# Patient Record
Sex: Female | Born: 1965 | Race: White | Hispanic: No | Marital: Married | Smoking: Never smoker
Health system: Southern US, Community
[De-identification: ages and names within clinical notes are randomized; demographics above are authoritative.]

## PROBLEM LIST (undated history)

## (undated) DIAGNOSIS — D6851 Activated protein C resistance: Secondary | ICD-10-CM

## (undated) DIAGNOSIS — I809 Phlebitis and thrombophlebitis of unspecified site: Secondary | ICD-10-CM

## (undated) DIAGNOSIS — I10 Essential (primary) hypertension: Secondary | ICD-10-CM

## (undated) HISTORY — PX: ABDOMINAL HYSTERECTOMY: SHX81

## (undated) HISTORY — DX: Essential (primary) hypertension: I10

## (undated) HISTORY — PX: BACK SURGERY: SHX140

## (undated) HISTORY — DX: Activated protein C resistance: D68.51

## (undated) HISTORY — PX: CHOLECYSTECTOMY: SHX55

## (undated) HISTORY — DX: Phlebitis and thrombophlebitis of unspecified site: I80.9

---

## 1998-05-12 HISTORY — PX: LUMBAR PERITONEAL SHUNT: SHX1984

## 2005-02-03 ENCOUNTER — Inpatient Hospital Stay: Payer: Self-pay | Admitting: Obstetrics and Gynecology

## 2006-05-12 DIAGNOSIS — I809 Phlebitis and thrombophlebitis of unspecified site: Secondary | ICD-10-CM

## 2006-05-12 DIAGNOSIS — D6851 Activated protein C resistance: Secondary | ICD-10-CM

## 2006-05-12 HISTORY — DX: Phlebitis and thrombophlebitis of unspecified site: I80.9

## 2006-05-12 HISTORY — DX: Activated protein C resistance: D68.51

## 2006-08-07 ENCOUNTER — Ambulatory Visit: Payer: Self-pay | Admitting: Unknown Physician Specialty

## 2006-10-08 ENCOUNTER — Ambulatory Visit: Payer: Self-pay | Admitting: Unknown Physician Specialty

## 2006-10-12 ENCOUNTER — Ambulatory Visit: Payer: Self-pay | Admitting: Unknown Physician Specialty

## 2007-04-26 ENCOUNTER — Ambulatory Visit: Payer: Self-pay | Admitting: Unknown Physician Specialty

## 2008-09-21 ENCOUNTER — Ambulatory Visit: Payer: Self-pay | Admitting: Unknown Physician Specialty

## 2009-08-30 ENCOUNTER — Emergency Department: Payer: Self-pay | Admitting: Emergency Medicine

## 2009-10-10 ENCOUNTER — Ambulatory Visit: Payer: Self-pay | Admitting: Gynecologic Oncology

## 2009-10-16 ENCOUNTER — Ambulatory Visit: Payer: Self-pay | Admitting: Gynecologic Oncology

## 2009-11-09 ENCOUNTER — Ambulatory Visit: Payer: Self-pay | Admitting: Gynecologic Oncology

## 2010-02-09 ENCOUNTER — Ambulatory Visit: Payer: Self-pay | Admitting: Gynecologic Oncology

## 2010-02-12 ENCOUNTER — Ambulatory Visit: Payer: Self-pay | Admitting: Gynecologic Oncology

## 2010-03-12 ENCOUNTER — Ambulatory Visit: Payer: Self-pay | Admitting: Gynecologic Oncology

## 2010-03-16 ENCOUNTER — Other Ambulatory Visit: Payer: Self-pay | Admitting: Internal Medicine

## 2010-04-02 ENCOUNTER — Ambulatory Visit: Payer: Self-pay | Admitting: Unknown Physician Specialty

## 2011-08-27 ENCOUNTER — Ambulatory Visit: Payer: Self-pay | Admitting: Obstetrics and Gynecology

## 2012-12-15 ENCOUNTER — Ambulatory Visit: Payer: Self-pay | Admitting: Obstetrics and Gynecology

## 2013-12-06 DIAGNOSIS — K589 Irritable bowel syndrome without diarrhea: Secondary | ICD-10-CM | POA: Insufficient documentation

## 2013-12-16 ENCOUNTER — Other Ambulatory Visit: Payer: Self-pay | Admitting: Gastroenterology

## 2013-12-16 LAB — CLOSTRIDIUM DIFFICILE(ARMC)

## 2013-12-25 LAB — STOOL CULTURE

## 2013-12-30 ENCOUNTER — Ambulatory Visit: Payer: Self-pay | Admitting: Gastroenterology

## 2013-12-30 LAB — PROTIME-INR
INR: 1.1
Prothrombin Time: 13.7 secs (ref 11.5–14.7)

## 2014-01-02 LAB — PATHOLOGY REPORT

## 2014-04-10 ENCOUNTER — Ambulatory Visit: Payer: Self-pay | Admitting: Physician Assistant

## 2014-07-13 DIAGNOSIS — D6851 Activated protein C resistance: Secondary | ICD-10-CM | POA: Insufficient documentation

## 2014-07-18 ENCOUNTER — Ambulatory Visit
Admit: 2014-07-18 | Disposition: A | Discharge status: Home / Self Care | Payer: Self-pay | Attending: Internal Medicine | Admitting: Internal Medicine

## 2014-08-04 ENCOUNTER — Ambulatory Visit
Admit: 2014-08-04 | Disposition: A | Discharge status: Home / Self Care | Payer: Self-pay | Attending: Internal Medicine | Admitting: Internal Medicine

## 2014-08-11 ENCOUNTER — Ambulatory Visit
Admit: 2014-08-11 | Disposition: A | Discharge status: Home / Self Care | Payer: Self-pay | Attending: Internal Medicine | Admitting: Internal Medicine

## 2015-08-23 DIAGNOSIS — Z6841 Body Mass Index (BMI) 40.0 and over, adult: Secondary | ICD-10-CM

## 2016-03-11 ENCOUNTER — Other Ambulatory Visit: Payer: Self-pay | Admitting: Physician Assistant

## 2016-03-11 DIAGNOSIS — Z1231 Encounter for screening mammogram for malignant neoplasm of breast: Secondary | ICD-10-CM

## 2016-04-17 ENCOUNTER — Ambulatory Visit: Payer: Self-pay

## 2016-04-21 ENCOUNTER — Ambulatory Visit: Payer: Self-pay

## 2016-05-23 ENCOUNTER — Ambulatory Visit: Payer: Self-pay

## 2016-06-10 ENCOUNTER — Inpatient Hospital Stay: Payer: 59 | Admitting: Oncology

## 2016-06-11 ENCOUNTER — Ambulatory Visit: Payer: Self-pay | Admitting: Hematology and Oncology

## 2016-06-13 ENCOUNTER — Inpatient Hospital Stay: Payer: 59 | Admitting: Oncology

## 2016-06-16 ENCOUNTER — Other Ambulatory Visit: Payer: Self-pay | Admitting: Physician Assistant

## 2016-06-16 ENCOUNTER — Ambulatory Visit
Admission: RE | Admit: 2016-06-16 | Discharge: 2016-06-16 | Disposition: A | Discharge status: Home / Self Care | Payer: 59 | Source: Ambulatory Visit | Attending: Physician Assistant | Admitting: Physician Assistant

## 2016-06-16 ENCOUNTER — Ambulatory Visit: Payer: 59

## 2016-06-16 DIAGNOSIS — Z1231 Encounter for screening mammogram for malignant neoplasm of breast: Secondary | ICD-10-CM

## 2016-07-01 ENCOUNTER — Encounter: Payer: Self-pay | Admitting: Oncology

## 2016-07-01 ENCOUNTER — Inpatient Hospital Stay: Payer: 59 | Attending: Oncology | Admitting: Oncology

## 2016-07-01 VITALS — BP 135/84 | HR 92 | Temp 97.6°F | Resp 18 | Ht 64.57 in | Wt 276.2 lb

## 2016-07-01 DIAGNOSIS — Z7901 Long term (current) use of anticoagulants: Secondary | ICD-10-CM | POA: Diagnosis not present

## 2016-07-01 DIAGNOSIS — Z86718 Personal history of other venous thrombosis and embolism: Secondary | ICD-10-CM | POA: Diagnosis not present

## 2016-07-01 DIAGNOSIS — I10 Essential (primary) hypertension: Secondary | ICD-10-CM | POA: Diagnosis not present

## 2016-07-01 DIAGNOSIS — G932 Benign intracranial hypertension: Secondary | ICD-10-CM | POA: Insufficient documentation

## 2016-07-01 DIAGNOSIS — I82501 Chronic embolism and thrombosis of unspecified deep veins of right lower extremity: Secondary | ICD-10-CM

## 2016-07-01 DIAGNOSIS — Z1231 Encounter for screening mammogram for malignant neoplasm of breast: Secondary | ICD-10-CM | POA: Diagnosis not present

## 2016-07-01 DIAGNOSIS — E669 Obesity, unspecified: Secondary | ICD-10-CM | POA: Diagnosis not present

## 2016-07-01 DIAGNOSIS — I8290 Acute embolism and thrombosis of unspecified vein: Secondary | ICD-10-CM | POA: Insufficient documentation

## 2016-07-01 DIAGNOSIS — D6851 Activated protein C resistance: Secondary | ICD-10-CM | POA: Diagnosis present

## 2016-07-01 DIAGNOSIS — J309 Allergic rhinitis, unspecified: Secondary | ICD-10-CM | POA: Insufficient documentation

## 2016-07-01 NOTE — Progress Notes (Signed)
Pt here for initial evaluation. She is referred by Ms Carrie Mew PA-pt wants to come off Warfarin and is here for evaluation for this

## 2016-07-01 NOTE — Progress Notes (Addendum)
Hematology/Oncology Consult note [facility] Telephone:([phone] [phone]  Patient Care Team: Marinda Elk, MD as PCP - General (Physician Assistant)   Name of the patient: Lisa Barry  [id number]  February 03, 1966    Reason for referral- h/o DVT and factor V leiden mutation. On chronic anticoagulation   Referring physician- Dr. Paulita Cradle   Date of visit: 07/01/16   History of presenting illness- Patient is a 51 yr old female with a h/o right lower extremity DVT about 10 years ago and was seen by vascular surgery Dr. Leotis Pain. She has some issues with varicose veins back then and underwent laser treatment. She is not exactly sure as to how the diagnosis of DVT came about when she does not recollect having any pain or swelling in her right lower extremity. Patient states that she has been on Coumadin since then but lately has been having problems maintaining her INR levels lately and she also reports periodic bruising Coumadin. There is a family history of DVT in both her mother and father when they were in their 36s. She thinks that they both have factor V Leiden but she is not sure. Patient was herself tested positive for factor V Leiden mutation although she is not sure if she was homozygous or heterozygous for the same and this testing was done at Dr. Bunnie Domino office as well. She was also seen by hematology here at [facility] but we are not able to retrieve those records. At this time she would like to come off her Coumadin if possible. Patient has not had any DVT/PE while she has been on Coumadin.   ECOG PS- 1  Pain scale- 0   Review of systems- Review of Systems  Constitutional: Negative for chills, fever, malaise/fatigue and weight loss.  HENT: Negative for congestion, ear discharge and nosebleeds.   Eyes: Negative for blurred vision.  Respiratory: Negative for cough, hemoptysis, sputum production, shortness of breath and wheezing.     Cardiovascular: Negative for chest pain, palpitations, orthopnea and claudication.  Gastrointestinal: Negative for abdominal pain, blood in stool, constipation, diarrhea, heartburn, melena, nausea and vomiting.  Genitourinary: Negative for dysuria, flank pain, frequency, hematuria and urgency.  Musculoskeletal: Negative for back pain, joint pain and myalgias.  Skin: Negative for rash.  Neurological: Negative for dizziness, tingling, focal weakness, seizures, weakness and headaches.  Endo/Heme/Allergies: Bruises/bleeds easily.  Psychiatric/Behavioral: Negative for depression and suicidal ideas. The patient does not have insomnia.     Allergies not on file  There are no active problems to display for this patient.    Past Medical History:  Diagnosis Date  . Blood clot associated with vein wall inflammation 2008  . Factor V Leiden ([place]) 2008  . Hypertension      Past Surgical History:  Procedure Laterality Date  . ABDOMINAL HYSTERECTOMY    . BACK SURGERY    . CHOLECYSTECTOMY    . LUMBAR PERITONEAL SHUNT  2000    Social History   Social History  . Marital status: Married    Spouse name: N/A  . Number of children: N/A  . Years of education: N/A   Occupational History  . Not on file.   Social History Main Topics  . Smoking status: Not on file  . Smokeless tobacco: Not on file  . Alcohol use Not on file  . Drug use: Unknown  . Sexual activity: Not on file   Other Topics Concern  . Not on file   Social History  Narrative  . No narrative on file     No family history on file.  No current outpatient prescriptions on file.   Physical exam:  Vitals:   07/01/16 1514  BP: 135/84  Pulse: 92  Resp: 18  Temp: 97.6 F (36.4 C)  TempSrc: Tympanic  Weight: 276 lb 3.8 oz (125.3 kg)  Height: 5' 4.57" (1.64 m)   Physical Exam  Constitutional: She is oriented to person, place, and time.  Patient is obese. Appears in no acute distress  HENT:  Head: Normocephalic  and atraumatic.  Eyes: EOM are normal. Pupils are equal, round, and reactive to light.  Neck: Normal range of motion.  Cardiovascular: Normal rate, regular rhythm and normal heart sounds.   Pulmonary/Chest: Effort normal and breath sounds normal.  Abdominal: Soft. Bowel sounds are normal.  Neurological: She is alert and oriented to person, place, and time.  Skin: Skin is warm and dry.        Mm Screening Breast Tomo Bilateral  Result Date: 06/16/2016 CLINICAL DATA:  Screening. EXAM: 2D DIGITAL SCREENING BILATERAL MAMMOGRAM WITH CAD AND ADJUNCT TOMO COMPARISON:  Previous exam(s). ACR Breast Density Category b: There are scattered areas of fibroglandular density. FINDINGS: There are no findings suspicious for malignancy. Images were processed with CAD. IMPRESSION: No mammographic evidence of malignancy. A result letter of this screening mammogram will be mailed directly to the patient. RECOMMENDATION: Screening mammogram in one year. (Code:SM-B-01Y) BI-RADS CATEGORY  1: Negative. Electronically Signed   By: Ammie Ferrier M.D.   On: 06/16/2016 12:02    Assessment and plan- Patient is a 51 y.o. female with a remote history of right lower extremity DVT and factor V Leiden mutation who is currently on Coumadin. Patient has been referred to [place] to see if she can come off anticoagulation.   Based on patient's history it appears that her DVT was diagnosed incidentally in the absence of any signs and symptoms. Also she probably had a distal lower extremity DVT and no evidence of proximal DVT. At this time I will try to obtain the results of the Doppler as well as factor V Leiden testing from Dr. Bunnie Domino office. If patient is found to be homozygous for factor V Leiden mutation or had an extensive right lower extremity DVT, then there remains a significant risk for recurrence once the Coumadin stops. In terms of other reversible risk factors patient is not a smoker but is obese which is a risk factor for  DVT. I have explained all this to the patient and we will get in touch with Dr. Bunnie Domino office and then give patient back call and decide about further management at that time. If patient is heterozygous for factor V Leiden mutation, then it would be reasonable for her to come off Coumadin but there remains a risk for recurrence in the future for both DVT and PE which the patient understands   Thank you for this kind referral and the opportunity to participate in the care of this patient   Visit Diagnosis 1. Factor V Leiden ([place])   2. Chronic deep vein thrombosis (DVT) of right lower extremity, unspecified vein (HCC)     Dr. Randa Evens, MD, MPH [facility] at [facility] Pager- [phone] 07/01/2016  4:33 PM    ADDENDUM 07/15/16: We were able to obtain genetic testing results that were done on the patient in the past which shows that she was heterozygous for factor V Leiden mutation. As such heterozygosity for factor  V Leiden mutation only modestly increases the chance of recurrent thromboembolism as compared to general population and does not play a role in the decision to continue or discontinue anticoagulation. We are unable to obtain prior Doppler results to see if patient had proximal lower extremity DVT in addition to distal lower extremity DVT which were on for a higher risk. I explained all this to the patient and recommended that this patient is having ongoing problems with bruising on Coumadin it may be okay to come off Coumadin at this time and continue low-dose aspirin at 81 mg. Patient does have other risk factors for recurrent DVT including obesity and she is trying to lose weight. Patient didn't continue to follow-up with her primary care doctor and if she were to have a recurrent DVT or PE in the future she will need to be restarted on anticoagulation and that has to be continued indefinitely at that time  Dr. Randa Evens, MD, MPH [facility] at [facility] [phone] 07/15/2016 2:18 PM

## 2016-09-02 ENCOUNTER — Encounter (INDEPENDENT_AMBULATORY_CARE_PROVIDER_SITE_OTHER): Payer: 59 | Admitting: Vascular Surgery

## 2016-09-09 ENCOUNTER — Encounter (INDEPENDENT_AMBULATORY_CARE_PROVIDER_SITE_OTHER): Payer: 59 | Admitting: Vascular Surgery

## 2016-10-07 ENCOUNTER — Encounter (INDEPENDENT_AMBULATORY_CARE_PROVIDER_SITE_OTHER): Payer: Self-pay

## 2016-10-07 ENCOUNTER — Ambulatory Visit (INDEPENDENT_AMBULATORY_CARE_PROVIDER_SITE_OTHER): Payer: 59 | Admitting: Vascular Surgery

## 2016-10-07 ENCOUNTER — Encounter (INDEPENDENT_AMBULATORY_CARE_PROVIDER_SITE_OTHER): Payer: Self-pay | Admitting: Vascular Surgery

## 2016-10-07 VITALS — BP 182/116 | HR 81 | Resp 16 | Ht 64.0 in | Wt 269.0 lb

## 2016-10-07 DIAGNOSIS — Z6841 Body Mass Index (BMI) 40.0 and over, adult: Secondary | ICD-10-CM

## 2016-10-07 DIAGNOSIS — I10 Essential (primary) hypertension: Secondary | ICD-10-CM

## 2016-10-07 DIAGNOSIS — I83813 Varicose veins of bilateral lower extremities with pain: Secondary | ICD-10-CM | POA: Diagnosis not present

## 2016-10-07 NOTE — Patient Instructions (Signed)
Varicose Vein Surgery Varicose vein surgery is a procedure to remove or repair varicose veins. These are swollen, twisted veins that are visible under the skin, especially in your legs. These veins may appear blue and bulging. All veins have a valve that keeps blood flowing in only one direction. If these valves get weak or damaged, blood can pool and cause varicose veins. You may have varicose vein surgery if your varicose veins are causing symptoms or complications, or if lifestyle changes have not helped. The surgery can reduce pain, aching, and the risk of bleeding and blood clots. Surgery can also improve the way the affected area looks (cosmetic appearance). The different types of varicose vein surgery include:  Injecting a chemical to close off a vein (sclerotherapy).  Treating a vein with light energy (laser treatment).  Using lasers or radio waves to close off a vein with heat (radiofrequency vein ablation).  Surgically removing the vein through a small incision (phlebectomy).  Surgically removing the vein through incisions after the vein has been tied off (vein ligation and stripping). Your health care provider will discuss the method that is best for you based on your condition. Tell a health care provider about:  Any allergies you have.  All medicines you are taking, including vitamins, herbs, eye drops, creams, and over-the-counter medicines.  Any problems you or family members have had with anesthetic medicines.  Any blood disorders you have.  Any surgeries you have had.  Any medical conditions you have. What are the risks? Generally, this is a safe procedure. However, problems can occur and include:  Damage to nearby nerves, tissues, or veins.  Sores.  Dark spots.  Skin irritation.  Numbness.  Clotting.  Infection.  Scarring.  Leg swelling.  Need for additional treatments. What happens before the procedure?  Ask your health care provider  about:  Changing or stopping your regular medicines. This is especially important if you are taking diabetes medicines or blood thinners.  Taking medicines such as aspirin and ibuprofen. These medicines can thin your blood. Do not take these medicines before your procedure if your health care provider instructs you not to.  You may have tests before varicose vein surgery. These can include a test to:  Check for clots and check blood flow using sound waves (Doppler ultrasound).  Observe how blood flows through your veins by injecting a dye that outlines your veins on X-rays (angiogram). This test is used in rare cases. What happens during the procedure? The procedure will vary depending on which type of varicose vein surgery you have: Sclerotherapy  This procedure is often used for small to medium veins.  A chemical (sclerosant) that irritates the lining of the vein will be injected into the vein. This will cause the varicose vein to be closed off.  Sclerosants in different amounts and strengths can be used, depending on the size and location of the vein.  You may need more than one treatment. Laser Treatment  This procedure does not involve incisions or chemicals.  Light energy from a laser will be directed onto the vein.  Laser treatment may be combined with sclerotherapy.  You may need more than one treatment. Radiofrequency Vein Ablation  You will be given a medicine that numbs the area (local anesthetic).  A small surgical cut (incision) will be made near the varicose vein.  A narrow tube (catheter) will be threaded into your vein.  The tip of the catheter will use either a laser or radio waves   to close the vein with heat. Phlebectomy  This surgical procedure is used to remove the veins closest to the skin.  You will be given a medicine that numbs the area (local anesthetic).  The surgeon will make a small puncture close to the varicose vein and then use a tiny hook  to pull out the vein. Vein Ligation and Stripping  This surgical procedure is used to treat severe cases.  For this procedure, you will be given a medicine that makes you go to sleep (general anesthetic).  The surgeon will make a small incision near the vein in your groin (saphenous vein).  First the surgeon will tie off (ligate) the vein.  Then the surgeon will make several more incisions along the vein.  The vein will be removed (stripped).  It may take 1-4 weeks to recover completely. What happens after the procedure?  Depending on the type of procedure that is performed, you may be able to return to your usual activities the day after the procedure.  You may have to wear compression stockings. These stockings help to prevent blood clots and reduce swelling in your legs. This information is not intended to replace advice given to you by your health care provider. Make sure you discuss any questions you have with your health care provider. Document Released: 05/18/2007 Document Revised: 10/04/2015 Document Reviewed: 10/05/2013 Elsevier Interactive Patient Education  2017 [employer]vier Inc.  

## 2016-10-07 NOTE — Assessment & Plan Note (Signed)
Contributes to her lower extremity swelling and increased venous pressure. Weight loss would be of benefit.

## 2016-10-07 NOTE — Assessment & Plan Note (Signed)
See venous plan as below

## 2016-10-07 NOTE — Progress Notes (Signed)
Patient ID: Lisa Barry, female   DOB: January 12, 1966, 51 y.o.   MRN: [id number]  Chief Complaint  Patient presents with  . Varicose Veins    Possible varicose veins    HPI Lisa Barry is a 51 y.o. female. The patient presents with complaints of symptomatic varicosities of the Lower extremities. The patient reports a long standing history of varicosities and they have become painful over time. She underwent treatment for venous disease about 6 years ago initially with good improvement but her symptoms have become worse over time. There was no clear inciting event or causative factor that started the symptoms.  The right leg is more severly affected. The patient elevates the legs for relief. The pain is described as stinging and burning. The symptoms are generally most severe in the evening, particularly when they have been on their feet for long periods of time. Elevation and anti-inflammatories has been used to try to improve the symptoms with limited success. She has also noticed more discoloration particularly in the right medial lower leg area. The patient complains of noticeable swelling as an associated symptom. The patient does have a previous history of DVT and factor V Leiden disorder.     Past Medical History:  Diagnosis Date  . Blood clot associated with vein wall inflammation 2008  . Factor V Leiden ([place]) 2008  . Hypertension     Past Surgical History:  Procedure Laterality Date  . ABDOMINAL HYSTERECTOMY    . BACK SURGERY    . CHOLECYSTECTOMY    . LUMBAR PERITONEAL SHUNT  2000    Family History  Problem Relation Age of Onset  . Hypertension Mother   . CAD Mother   . COPD Mother   . Bowel Disease Father   . Stroke Father   . Liver disease Sister     Social History Social History  Substance Use Topics  . Smoking status: Never Smoker  . Smokeless tobacco: Never Used  . Alcohol use 1.2 oz/week    2 Glasses of wine per week  No IV drug use she is  applying  Allergies  Allergen Reactions  . Other Other (See Comments)    AVOID STEROIDS DUE TO PREVIOUS EYE CONDITION  BIRTH CONTROL PILLS  . Sulfa Antibiotics     Other reaction(s): Unknown  . Tetracyclines & Related     Other reaction(s): Unknown    Current Outpatient Prescriptions  Medication Sig Dispense Refill  . amLODipine (NORVASC) 10 MG tablet Take 5 mg by mouth.     . citalopram (CELEXA) 20 MG tablet Take by mouth.    . furosemide (LASIX) 20 MG tablet Take by mouth.    . hyoscyamine (LEVBID) 0.375 MG 12 hr tablet     . loratadine (CLARITIN) 10 MG tablet Take by mouth.    . ondansetron (ZOFRAN) 4 MG tablet Take by mouth.    . potassium chloride (K-DUR) 10 MEQ tablet Take by mouth.    . valACYclovir (VALTREX) 500 MG tablet Take by mouth.    . valsartan-hydrochlorothiazide (DIOVAN-HCT) 320-12.5 MG tablet Take by mouth.     No current facility-administered medications for this visit.       REVIEW OF SYSTEMS (Negative unless checked)  Constitutional: [] Weight loss  [] Fever  [] Chills Cardiac: [] Chest pain   [] Chest pressure   [] Palpitations   [] Shortness of breath when laying flat   [] Shortness of breath at rest   [] Shortness of breath with exertion. Vascular:  [] Pain in legs  with walking   [] Pain in legs at rest   [] Pain in legs when laying flat   [] Claudication   [] Pain in feet when walking  [] Pain in feet at rest  [] Pain in feet when laying flat   [x] History of DVT   [] Phlebitis   [x] Swelling in legs   [x] Varicose veins   [] Non-healing ulcers Pulmonary:   [] Uses home oxygen   [] Productive cough   [] Hemoptysis   [] Wheeze  [] COPD   [] Asthma Neurologic:  [] Dizziness  [] Blackouts   [] Seizures   [] History of stroke   [] History of TIA  [] Aphasia   [] Temporary blindness   [] Dysphagia   [] Weakness or numbness in arms   [] Weakness or numbness in legs Musculoskeletal:  [] Arthritis   [] Joint swelling   [] Joint pain   [] Low back pain Hematologic:  [] Easy bruising  [] Easy bleeding    [] Hypercoagulable state   [] Anemic  [] Hepatitis Gastrointestinal:  [] Blood in stool   [] Vomiting blood  [] Gastroesophageal reflux/heartburn   [] Abdominal pain Genitourinary:  [] Chronic kidney disease   [] Difficult urination  [] Frequent urination  [] Burning with urination   [] Hematuria Skin:  [] Rashes   [] Ulcers   [] Wounds Psychological:  [] History of anxiety   []  History of major depression.    Physical Exam BP (!) 182/116 (BP Location: Right Arm)   Pulse 81   Resp 16   Ht 5\' 4"  (1.626 m)   Wt 269 lb (122 kg)   BMI 46.17 kg/m  Gen:  WD/WN, NAD Head: [place]/AT, No temporalis wasting.  Ear/Nose/Throat: Hearing grossly intact, dentition good Eyes: Sclera non-icteric. Conjunctiva clear Neck: Supple, no nuchal rigidity. Trachea midline Pulmonary:  Good air movement, no use of accessory muscles, respirations not labored.  Cardiac: RRR, No JVD Vascular: Varicosities diffuse and measuring up to 3 mm in the right lower extremity        Varicosities scattered and measuring up to 2 mm in the left lower extremity Vessel Right Left  Radial Palpable Palpable  Ulnar Palpable Palpable  Brachial Palpable Palpable  Carotid Palpable, without bruit Palpable, without bruit  Aorta Not palpable N/A  Femoral Palpable Palpable  Popliteal Palpable Palpable  PT Palpable Palpable  DP Palpable Palpable   Gastrointestinal: soft, non-tender/non-distended. Musculoskeletal: M/S 5/5 throughout.   1 + RLE edema.  Trace to 1 + LLE edema Neurologic: Sensation grossly intact in extremities.  Symmetrical.  Speech is fluent.  Psychiatric: Judgment intact, Mood & affect appropriate for pt's clinical situation. Dermatologic: No rashes or ulcers noted.  No cellulitis or open wounds.    Radiology No results found.  Labs No results found for this or any previous visit (from the past 2160 hour(s)).  Assessment/Plan:  HTN (hypertension) blood pressure control important in reducing the progression of  atherosclerotic disease. On appropriate oral medications.   Morbid obesity with BMI of 40.0-44.9, adult (HCC) Contributes to her lower extremity swelling and increased venous pressure. Weight loss would be of benefit.  Varicose veins of leg with pain, bilateral See venous plan as below    The patient has symptoms consistent with chronic venous insufficiency. We discussed the natural history and treatment options for venous disease. I recommended the regular use of 20 - 30 mm Hg compression stockings, and prescribed these today. I recommended leg elevation and anti-inflammatories as needed for pain. I have also recommended a complete venous duplex to assess the venous system for reflux or thrombotic issues. This can be done at the patient's convenience. I will see the patient back in  3 months to assess the response to conservative management, and determine further treatment options.     Leotis Pain 10/07/2016, 2:05 PM   This note was created with Dragon medical transcription system.  Any errors from dictation are unintentional.

## 2016-10-07 NOTE — Assessment & Plan Note (Signed)
blood pressure control important in reducing the progression of atherosclerotic disease. On appropriate oral medications.  

## 2017-01-14 ENCOUNTER — Other Ambulatory Visit (INDEPENDENT_AMBULATORY_CARE_PROVIDER_SITE_OTHER): Payer: Self-pay | Admitting: Vascular Surgery

## 2017-01-14 DIAGNOSIS — R52 Pain, unspecified: Secondary | ICD-10-CM

## 2017-01-16 ENCOUNTER — Ambulatory Visit (INDEPENDENT_AMBULATORY_CARE_PROVIDER_SITE_OTHER): Payer: 59 | Admitting: Vascular Surgery

## 2017-01-16 ENCOUNTER — Encounter (INDEPENDENT_AMBULATORY_CARE_PROVIDER_SITE_OTHER): Payer: 59

## 2017-02-27 ENCOUNTER — Other Ambulatory Visit: Payer: Self-pay | Admitting: Physician Assistant

## 2017-02-27 ENCOUNTER — Ambulatory Visit
Admission: RE | Admit: 2017-02-27 | Discharge: 2017-02-27 | Disposition: A | Discharge status: Home / Self Care | Payer: 59 | Source: Ambulatory Visit | Attending: Physician Assistant | Admitting: Physician Assistant

## 2017-02-27 ENCOUNTER — Other Ambulatory Visit
Admission: RE | Admit: 2017-02-27 | Discharge: 2017-02-27 | Disposition: A | Discharge status: Home / Self Care | Payer: 59 | Source: Ambulatory Visit | Attending: Physician Assistant | Admitting: Physician Assistant

## 2017-02-27 DIAGNOSIS — R0602 Shortness of breath: Secondary | ICD-10-CM | POA: Diagnosis present

## 2017-02-27 DIAGNOSIS — R0789 Other chest pain: Secondary | ICD-10-CM | POA: Diagnosis not present

## 2017-02-27 DIAGNOSIS — R7989 Other specified abnormal findings of blood chemistry: Secondary | ICD-10-CM

## 2017-02-27 LAB — FIBRIN DERIVATIVES D-DIMER (ARMC ONLY): Fibrin derivatives D-dimer (ARMC): 612.61 ng/mL (FEU) — ABNORMAL HIGH (ref 0.00–499.00)

## 2017-02-27 MED ORDER — IOPAMIDOL (ISOVUE-370) INJECTION 76%
75.0000 mL | Freq: Once | INTRAVENOUS | Status: AC | PRN
  Administered 2017-02-27: 75 mL via INTRAVENOUS

## 2017-04-24 ENCOUNTER — Other Ambulatory Visit: Payer: Self-pay | Admitting: Physician Assistant

## 2017-04-24 DIAGNOSIS — Z1231 Encounter for screening mammogram for malignant neoplasm of breast: Secondary | ICD-10-CM

## 2017-09-16 ENCOUNTER — Ambulatory Visit
Admission: RE | Admit: 2017-09-16 | Discharge: 2017-09-16 | Disposition: A | Discharge status: Home / Self Care | Payer: Managed Care, Other (non HMO) | Source: Ambulatory Visit | Attending: Physician Assistant | Admitting: Physician Assistant

## 2017-09-16 DIAGNOSIS — Z1231 Encounter for screening mammogram for malignant neoplasm of breast: Secondary | ICD-10-CM | POA: Insufficient documentation

## 2018-05-21 ENCOUNTER — Other Ambulatory Visit: Payer: Self-pay | Admitting: Physician Assistant

## 2018-05-21 DIAGNOSIS — Z1231 Encounter for screening mammogram for malignant neoplasm of breast: Secondary | ICD-10-CM

## 2018-11-10 DIAGNOSIS — G459 Transient cerebral ischemic attack, unspecified: Secondary | ICD-10-CM

## 2018-11-10 HISTORY — DX: Transient cerebral ischemic attack, unspecified: G45.9

## 2018-11-21 ENCOUNTER — Emergency Department
Admission: EM | Admit: 2018-11-21 | Discharge: 2018-11-21 | Disposition: A | Discharge status: Other Acute Inpatient Hospital | Payer: Managed Care, Other (non HMO) | Attending: Emergency Medicine | Admitting: Emergency Medicine

## 2018-11-21 ENCOUNTER — Other Ambulatory Visit: Payer: Self-pay

## 2018-11-21 ENCOUNTER — Emergency Department: Payer: Managed Care, Other (non HMO)

## 2018-11-21 ENCOUNTER — Encounter: Payer: Self-pay | Admitting: Emergency Medicine

## 2018-11-21 DIAGNOSIS — I10 Essential (primary) hypertension: Secondary | ICD-10-CM | POA: Insufficient documentation

## 2018-11-21 DIAGNOSIS — R42 Dizziness and giddiness: Secondary | ICD-10-CM | POA: Diagnosis present

## 2018-11-21 DIAGNOSIS — I609 Nontraumatic subarachnoid hemorrhage, unspecified: Secondary | ICD-10-CM | POA: Diagnosis not present

## 2018-11-21 DIAGNOSIS — Z79899 Other long term (current) drug therapy: Secondary | ICD-10-CM | POA: Diagnosis not present

## 2018-11-21 DIAGNOSIS — Z982 Presence of cerebrospinal fluid drainage device: Secondary | ICD-10-CM | POA: Insufficient documentation

## 2018-11-21 DIAGNOSIS — Z03818 Encounter for observation for suspected exposure to other biological agents ruled out: Secondary | ICD-10-CM | POA: Insufficient documentation

## 2018-11-21 DIAGNOSIS — G932 Benign intracranial hypertension: Secondary | ICD-10-CM | POA: Diagnosis not present

## 2018-11-21 LAB — URINALYSIS, COMPLETE (UACMP) WITH MICROSCOPIC
Bacteria, UA: NONE SEEN
Bilirubin Urine: NEGATIVE
Glucose, UA: 50 mg/dL — AB
Hgb urine dipstick: NEGATIVE
Ketones, ur: 20 mg/dL — AB
Leukocytes,Ua: NEGATIVE
Nitrite: NEGATIVE
Protein, ur: NEGATIVE mg/dL
Specific Gravity, Urine: 1.02 (ref 1.005–1.030)
pH: 8 (ref 5.0–8.0)

## 2018-11-21 LAB — COMPREHENSIVE METABOLIC PANEL
ALT: 24 U/L (ref 0–44)
AST: 25 U/L (ref 15–41)
Albumin: 4.3 g/dL (ref 3.5–5.0)
Alkaline Phosphatase: 63 U/L (ref 38–126)
Anion gap: 9 (ref 5–15)
BUN: 24 mg/dL — ABNORMAL HIGH (ref 6–20)
CO2: 25 mmol/L (ref 22–32)
Calcium: 8.8 mg/dL — ABNORMAL LOW (ref 8.9–10.3)
Chloride: 105 mmol/L (ref 98–111)
Creatinine, Ser: 0.6 mg/dL (ref 0.44–1.00)
GFR calc Af Amer: >60 mL/min (ref >60)
GFR calc non Af Amer: >60 mL/min (ref >60)
Glucose, Bld: 177 mg/dL — ABNORMAL HIGH (ref 70–99)
Potassium: 3.9 mmol/L (ref 3.5–5.1)
Sodium: 139 mmol/L (ref 135–145)
Total Bilirubin: 0.7 mg/dL (ref 0.3–1.2)
Total Protein: 7.4 g/dL (ref 6.5–8.1)

## 2018-11-21 LAB — CBC
HCT: 43.9 % (ref 36.0–46.0)
Hemoglobin: 14.4 g/dL (ref 12.0–15.0)
MCH: 27.7 pg (ref 26.0–34.0)
MCHC: 32.8 g/dL (ref 30.0–36.0)
MCV: 84.4 fL (ref 80.0–100.0)
Platelets: 222 10*3/uL (ref 150–400)
RBC: 5.2 MIL/uL — ABNORMAL HIGH (ref 3.87–5.11)
RDW: 13.5 % (ref 11.5–15.5)
WBC: 7.2 10*3/uL (ref 4.0–10.5)
nRBC: 0 % (ref 0.0–0.2)

## 2018-11-21 LAB — SAMPLE TO BLOOD BANK

## 2018-11-21 LAB — PROTIME-INR
INR: 0.9 (ref 0.8–1.2)
Prothrombin Time: 12.4 seconds (ref 11.4–15.2)

## 2018-11-21 LAB — LIPASE, BLOOD: Lipase: 32 U/L (ref 11–51)

## 2018-11-21 LAB — APTT: aPTT: 31 seconds (ref 24–36)

## 2018-11-21 LAB — SARS CORONAVIRUS 2 BY RT PCR (HOSPITAL ORDER, PERFORMED IN ~~LOC~~ HOSPITAL LAB): SARS Coronavirus 2: NEGATIVE

## 2018-11-21 MED ORDER — MORPHINE SULFATE (PF) 4 MG/ML IV SOLN
4.0000 mg | Freq: Once | INTRAVENOUS | Status: AC
  Administered 2018-11-21: 4 mg via INTRAVENOUS
  Filled 2018-11-21: qty 1

## 2018-11-21 MED ORDER — ONDANSETRON HCL 4 MG/2ML IJ SOLN
4.0000 mg | Freq: Once | INTRAMUSCULAR | Status: AC | PRN
  Administered 2018-11-21: 4 mg via INTRAVENOUS
  Filled 2018-11-21: qty 2

## 2018-11-21 MED ORDER — LEVETIRACETAM IN NACL 1000 MG/100ML IV SOLN
1000.0000 mg | Freq: Once | INTRAVENOUS | Status: AC
  Administered 2018-11-21: 1000 mg via INTRAVENOUS
  Filled 2018-11-21: qty 100

## 2018-11-21 MED ORDER — ACETAMINOPHEN 325 MG PO TABS
650.0000 mg | ORAL_TABLET | Freq: Once | ORAL | Status: AC
  Administered 2018-11-21: 650 mg via ORAL
  Filled 2018-11-21: qty 2

## 2018-11-21 MED ORDER — ONDANSETRON HCL 4 MG/2ML IJ SOLN
4.0000 mg | Freq: Once | INTRAMUSCULAR | Status: AC
  Administered 2018-11-21: 4 mg via INTRAVENOUS
  Filled 2018-11-21: qty 2

## 2018-11-21 MED ORDER — NICARDIPINE HCL IN NACL 20-0.86 MG/200ML-% IV SOLN
0.0000 mg/h | INTRAVENOUS | Status: DC
  Administered 2018-11-21: 2 mg/h via INTRAVENOUS
  Filled 2018-11-21: qty 200

## 2018-11-21 MED ORDER — SODIUM CHLORIDE 0.9% FLUSH: 3.0000 mL | Freq: Once | INTRAVENOUS | Status: DC

## 2018-11-21 NOTE — ED Provider Notes (Addendum)
Medical screening examination/treatment/procedure(s) were conducted as a shared visit with non-physician practitioner(s) and myself.  I personally evaluated the patient during the encounter.    Patient denies fever, no recent illness. No cough or known COVID exposure.  Lisa Barry was evaluated in Emergency Department on 11/21/2018 for the symptoms described in the history of present illness. She was evaluated in the context of the global COVID-19 pandemic, which necessitated consideration that the patient might be at risk for infection with the SARS-CoV-2 virus that causes COVID-19. Institutional protocols and algorithms that pertain to the evaluation of patients at risk for COVID-19 are in a state of rapid change based on information released by regulatory bodies including the CDC and federal and state organizations. These policies and algorithms were followed during the patient's care in the ED.  Clinical Course as of Nov 20 1444  Sun Nov 21, 2018  1344 Stat neurosurgical transfer requested to [facility]. Patient reports prior treatment at [facility] and prefers transfer back to [facility]. Added pain medication and nicardipine infusion ordered. Discussed with [facility] RN, getting neurosurgery.    [MQ]  1610 Patient reports she is NOT anticoagulated, takes no blood thinners. INr/PTT pending.    [MQ]  9604 Accepted to [facility] as emergency transfer for neurosurgical and neuro ICU care. Patient agreeable with plan.   [MQ]  1405 Pain improving and blood pressure improving.  Blood pressure now 540 systolic.  We will start nicardipine at 2 mg/h with goal to keep systolic just less than 981X.   [MQ]  1426 Bp improving. On nicardipine, monitoring closely.   [MQ]    Clinical Course User Index [MQ] Delman Kitten, MD      CRITICAL CARE Performed by: Delman Kitten   Total critical care time: 65 minutes  Critical care time was exclusive of separately billable procedures and treating other patients.  Critical  care was necessary to treat or prevent imminent or life-threatening deterioration.  Critical care was time spent personally by me on the following activities: development of treatment plan with patient and/or surrogate as well as nursing, discussions with consultants, evaluation of patient's response to treatment, examination of patient, obtaining history from patient or surrogate, ordering and performing treatments and interventions, ordering and review of laboratory studies, ordering and review of radiographic studies, pulse oximetry and re-evaluation of patient's condition.  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  Patient required extensive added critical care time due to need for consultation and emergent transfer to [facility], initiation of antihypertensive infusion, antiepileptics, discussion with patient and family, decision making, and disposition.  Ct Head Wo Contrast  Result Date: 11/21/2018 CLINICAL DATA:  Acute onset severe headache.  Dizziness. EXAM: CT HEAD WITHOUT CONTRAST TECHNIQUE: Contiguous axial images were obtained from the base of the skull through the vertex without intravenous contrast. COMPARISON:  None. FINDINGS: Brain: There is subarachnoid hemorrhage diffusely. There is no subdural or epidural fluid collection. No midline shift. No mass evident. The ventricles are within normal limits with respect to size and configuration. Note that there is hemorrhage throughout virtually all sulcal regions bilaterally. No acute infarct evident. There is preservation of gray/white differentiation. Vascular: There is mild vascular calcification in the carotid siphon regions bilaterally. No aneurysm is obvious on this noncontrast enhanced study. Skull: The bony calvarium appears intact. Sinuses/Orbits: Visualized paranasal sinuses are clear. Orbits appear symmetric bilaterally. Other: Mastoid air cells are clear. IMPRESSION: Extensive subarachnoid hemorrhage filling essentially all sulci  bilaterally. Suspect intracranial aneurysm rupture as most likely etiology. Site of  aneurysm not seen on noncontrast enhanced study. No acute infarct evident. There is preservation of gray/white differentiation. No midline shift or extra-axial fluid. Mild arterial vascular calcification noted. Critical Value/emergent results were called by telephone at the time of interpretation on 11/21/2018 at 1:35 pm to Dr. Delman Kitten , who verbally acknowledged these results. Electronically Signed   By: Lowella Grip III M.D.   On: 11/21/2018 13:35   Discussed with Dr. Jasmine December personally  CT scan powersharing CT head to [facility].  Patient is alert, oriented. Reports ongoing moderate to severe headache.    Delman Kitten, MD 11/21/18 1347   ----------------------------------------- 2:46 PM on 11/21/2018 -----------------------------------------  Vitals:   11/21/18 1400 11/21/18 1415  BP: (!) 148/82 (!) 144/53  Pulse: 69 66  Resp: 20 19  SpO2: 99% 100%    Reassessment completed.  Blood pressure improving.  Patient resting with eyes closed, easily alerts to voice.  Appears much more comfortable.  Hemodynamics improving awaiting [facility] team arrival for emergency transfer. Stable for transfer with SCTP team [facility].  Patient and her husband is also at the bedside agreeable with plan.  Understanding of risks and benefits of transfer.  Patient is currently deemed stable for transfer for further evaluation at [facility] neuro ICU and [facility] neurosurgical services.      Delman Kitten, MD 11/21/18 [phone]

## 2018-11-21 NOTE — ED Notes (Signed)
Patient transported to X-ray 

## 2018-11-21 NOTE — ED Provider Notes (Signed)
[facility] Emergency Department Provider Note ___________________________________________   None    (approximate)  I have reviewed the triage vital signs and the nursing notes.   HISTORY  Chief Complaint Emesis, Dizziness, and Headache  HPI Lisa Barry is a 53 y.o. female with a history of LP shunt secondary to [place] who presents to the emergency department for treatment and evaluation of dizziness, headache, and neck pain with nausea and vomiting. Symptoms started at 9:00 this morning while in the shower. No alleviating measures prior to arrival.    Past Medical History:  Diagnosis Date   Blood clot associated with vein wall inflammation 2008   Factor V Leiden ([place]) 2008   Hypertension     Patient Active Problem List   Diagnosis Date Noted   Varicose veins of leg with pain, bilateral 10/07/2016   Allergic rhinitis 07/01/2016   HTN (hypertension) 07/01/2016   Obesity 07/01/2016   Pseudotumor cerebri 07/01/2016   Venous thrombosis 07/01/2016   Morbid obesity with BMI of 40.0-44.9, adult ([place]) 08/23/2015   Factor V Leiden ([place]) 07/13/2014   IBS (irritable bowel syndrome) 12/06/2013    Past Surgical History:  Procedure Laterality Date   ABDOMINAL HYSTERECTOMY     BACK SURGERY     CHOLECYSTECTOMY     LUMBAR PERITONEAL SHUNT  2000    Prior to Admission medications   Medication Sig Start Date End Date Taking? Authorizing Provider  amLODipine (NORVASC) 10 MG tablet Take 5 mg by mouth.  03/11/16   [provider]  citalopram (CELEXA) 20 MG tablet Take by mouth. 03/11/16   [provider]  furosemide (LASIX) 20 MG tablet Take by mouth. 03/11/16   [provider]  hyoscyamine (LEVBID) 0.375 MG 12 hr tablet  07/07/16   [provider]  loratadine (CLARITIN) 10 MG tablet Take by mouth. 03/11/16   [provider]  ondansetron (ZOFRAN) 4 MG tablet Take by mouth. 03/11/16   [provider]  potassium chloride (K-DUR) 10 MEQ tablet Take by mouth. 03/11/16   [provider]  valACYclovir (VALTREX) 500 MG tablet Take by mouth. 08/23/15   [provider]  valsartan-hydrochlorothiazide (DIOVAN-HCT) 320-12.5 MG tablet Take by mouth. 03/11/16 03/11/17  [provider]    Allergies Ace inhibitors, Corticosteroids, Other, Penicillins, Sulfa antibiotics, Tetracycline, and Tetracyclines & related  Family History  Problem Relation Age of Onset   Hypertension Mother    CAD Mother    COPD Mother    Bowel Disease Father    Stroke Father    Liver disease Sister     Social History Social History   Tobacco Use   Smoking status: Never Smoker   Smokeless tobacco: Never Used  Substance Use Topics   Alcohol use: Yes    Alcohol/week: 2.0 standard drinks    Types: 2 Glasses of wine per week   Drug use: No    Review of Systems  Constitutional: No fever/chills Eyes: No blurred vision or visual changes. ENT: No sore throat. Cardiovascular: Denies chest pain. Respiratory: Denies shortness of breath. Gastrointestinal: No abdominal pain.  Positive for nausea and vomiting.  No diarrhea.  No constipation. Genitourinary: Negative for dysuria. Musculoskeletal: Negative for back pain. Skin: Negative for rash. Neurological: Negative for headaches, focal weakness or numbness. ___________________________________________   PHYSICAL EXAM:  VITAL SIGNS: ED Triage Vitals [11/21/18 1155]  Enc Vitals Group     BP (!) 170/103     Pulse Rate 84  Resp 20     Temp      Temp src      SpO2 96 %     Weight 270 lb (122.5 kg)     Height 5\' 5"  (1.651 m)     Head Circumference      Peak Flow      Pain Score 10     Pain Loc      Pain Edu?      Excl. in [place]?     Constitutional: Alert and oriented. Well appearing and in no acute distress. Eyes: Conjunctivae are normal. PERRL. Head: Atraumatic. Nose: No  congestion/rhinnorhea. Mouth/Throat: Mucous membranes are moist.  Oropharynx non-erythematous. Neck: No stridor.   Cardiovascular: Normal rate, regular rhythm. Grossly normal heart sounds.  Good peripheral circulation. Respiratory: Normal respiratory effort.  No retractions. Lungs CTAB. Gastrointestinal: Soft and nontender. No distention. No abdominal bruits. No CVA tenderness. Musculoskeletal: No lower extremity edema.  No joint effusions. Neurologic:  Normal speech and language. No gross focal neurologic deficits are appreciated. Cranial nerves II-XII normal as tested. No pronator drift.  Skin:  Skin is warm, dry and intact. No rash noted. Psychiatric: Mood and affect are normal. Speech and behavior are normal.  ____________________________________________   LABS (all labs ordered are listed, but only abnormal results are displayed)  Labs Reviewed  COMPREHENSIVE METABOLIC PANEL - Abnormal; Notable for the following components:      Result Value   Glucose, Bld 177 (*)    BUN 24 (*)    Calcium 8.8 (*)    All other components within normal limits  CBC - Abnormal; Notable for the following components:   RBC 5.20 (*)    All other components within normal limits  URINALYSIS, COMPLETE (UACMP) WITH MICROSCOPIC - Abnormal; Notable for the following components:   Color, Urine YELLOW (*)    APPearance CLOUDY (*)    Glucose, UA 50 (*)    Ketones, ur 20 (*)    All other components within normal limits  SARS CORONAVIRUS 2 (HOSPITAL ORDER, [facility])  LIPASE, BLOOD  PROTIME-INR  APTT  SAMPLE TO BLOOD BANK   ____________________________________________  EKG  Not indicated ____________________________________________  RADIOLOGY  ED MD interpretation:  Subarachnoid hemorrhage  Official radiology report(s): Dg Lumbar Spine 2-3 Views  Result Date: 11/21/2018 CLINICAL DATA:  Dizziness and headache. Vomiting. EXAM: LUMBAR SPINE - 2-3 VIEW COMPARISON:  None.  FINDINGS: There is no evidence of lumbar spine fracture. Alignment is normal. Multilevel osteoarthritic changes. Lumbar peritoneal shunt is seen at the level of L1-L2 vertebral bodies. No radiographically apparent interruptions in the course of the shunt tubing. IMPRESSION: Lumbar peritoneal shunt with no apparent interruptions in the course of the shunt tubing. Electronically Signed   By: Fidela Salisbury M.D.   On: 11/21/2018 13:58   Ct Head Wo Contrast  Result Date: 11/21/2018 CLINICAL DATA:  Acute onset severe headache.  Dizziness. EXAM: CT HEAD WITHOUT CONTRAST TECHNIQUE: Contiguous axial images were obtained from the base of the skull through the vertex without intravenous contrast. COMPARISON:  None. FINDINGS: Brain: There is subarachnoid hemorrhage diffusely. There is no subdural or epidural fluid collection. No midline shift. No mass evident. The ventricles are within normal limits with respect to size and configuration. Note that there is hemorrhage throughout virtually all sulcal regions bilaterally. No acute infarct evident. There is preservation of gray/white differentiation. Vascular: There is mild vascular calcification in the carotid siphon regions bilaterally. No aneurysm is obvious on this  noncontrast enhanced study. Skull: The bony calvarium appears intact. Sinuses/Orbits: Visualized paranasal sinuses are clear. Orbits appear symmetric bilaterally. Other: Mastoid air cells are clear. IMPRESSION: Extensive subarachnoid hemorrhage filling essentially all sulci bilaterally. Suspect intracranial aneurysm rupture as most likely etiology. Site of aneurysm not seen on noncontrast enhanced study. No acute infarct evident. There is preservation of gray/white differentiation. No midline shift or extra-axial fluid. Mild arterial vascular calcification noted. Critical Value/emergent results were called by telephone at the time of interpretation on 11/21/2018 at 1:35 pm to Dr. Delman Kitten , who verbally  acknowledged these results. Electronically Signed   By: Lowella Grip III M.D.   On: 11/21/2018 13:35    ____________________________________________   PROCEDURES  Procedure(s) performed (including Critical Care):  Procedures   ____________________________________________   INITIAL IMPRESSION / ASSESSMENT AND PLAN / ED COURSE  As part of my medical decision making, I reviewed the following data within the [facility] by EM attending Dr. Jacqualine Code  53 year old female presents to the ER for evaluation of acute onset headache this morning. Dizziness, nausea, and vomiting followed. Patient arrived via EMS. Hypertensive enroute and remains hypertensive here. CT head ordered.  ----------------------------------------- 1:40 PM on 11/21/2018 ----------------------------------------- CT report discussed with Dr. Jacqualine Code who has requested to speak with neurosurgeon at [facility]. Patient agrees to be transferred after results of CT were discussed with her.    ----------------------------------------- 2:25 PM on 11/21/2018 -----------------------------------------  Patient and family updated. [facility] eta 30 minutes. Blood pressure improving. Patient continues to complain mainly of neck pain. No change in neuro status noted.   ----------------------------------------- 3:00 PM on 11/21/2018 -----------------------------------------  [facility] arriving. Blood pressure 129/70, heart rate 82.   CRITICAL CARE Performed by: Sherrie George   Total critical care time: 60 minutes  Critical care time was exclusive of separately billable procedures and treating other patients.  Critical care was necessary to treat or prevent imminent or life-threatening deterioration.  Critical care was time spent personally by me on the following activities: development of treatment plan with patient and/or surrogate as well as nursing, discussions with consultants, evaluation of  patient's response to treatment, examination of patient, obtaining history from patient or surrogate, ordering and performing treatments and interventions, ordering and review of laboratory studies, ordering and review of radiographic studies, pulse oximetry and re-evaluation of patient's condition.   Clinical Course as of Nov 21 2103  Sun Nov 21, 2018  1344 Stat neurosurgical transfer requested to [facility]. Patient reports prior treatment at [facility] and prefers transfer back to [facility]. Added pain medication and nicardipine infusion ordered. Discussed with [facility] RN, getting neurosurgery.    [MQ]  7078 Patient reports she is NOT anticoagulated, takes no blood thinners. INr/PTT pending.    [MQ]  6754 Accepted to [facility] as emergency transfer for neurosurgical and neuro ICU care. Patient agreeable with plan.   [MQ]  1405 Pain improving and blood pressure improving.  Blood pressure now 492 systolic.  We will start nicardipine at 2 mg/h with goal to keep systolic just less than 010O.   [MQ]  1426 Bp improving. On nicardipine, monitoring closely.   [MQ]    Clinical Course User Index [MQ] Delman Kitten, MD     ____________________________________________   FINAL CLINICAL IMPRESSION(S) / ED DIAGNOSES  Final diagnoses:  Presence of lumboperitoneal shunt  SAH (subarachnoid hemorrhage) [facility])     ED Discharge Orders    None       Note:  This document was prepared  using Systems analyst and may include unintentional dictation errors.    Victorino Dike, FNP 11/21/18 2106    Delman Kitten, MD 11/22/18 2129

## 2018-11-21 NOTE — ED Notes (Signed)
[facility] ETA apx 53min. Husband at bedside.

## 2018-11-21 NOTE — ED Triage Notes (Signed)
Pt arrived via [facility] from home with reports of dizziness while in the shower around 0900 this morning. Pt developed headache and neck pain as well.  Pt c/o nausea with EMS however, on arrival to triage room patient began vomiting.  Pt reports having a lumbar shunt placed in 1996 at [facility] due to her spine holding fluid. Pt states she has not had any problems with the shunt.  Pt reports headache pain is 10/10 and is requesting something for pain while in triage.  Pt also requesting that her shunt be checked here.

## 2018-11-21 NOTE — ED Notes (Signed)
Attempted to give report to receiving RN x3. No answer when transferred.

## 2018-11-21 NOTE — ED Notes (Signed)
Discussed pt with Dr. Jacqualine Code, informed him of patient's headache and vomiting in triage, new orders received for abdominal labs, EKG and CT head without contrast.

## 2018-11-22 DIAGNOSIS — I609 Nontraumatic subarachnoid hemorrhage, unspecified: Secondary | ICD-10-CM | POA: Insufficient documentation

## 2018-11-22 MED ORDER — HYDRALAZINE HCL 20 MG/ML IJ SOLN
10.00 | INTRAMUSCULAR | Status: DC
Start: ? — End: 2018-11-22

## 2018-11-22 MED ORDER — OXYCODONE HCL 5 MG PO TABS
5.00 | ORAL_TABLET | ORAL | Status: DC
Start: ? — End: 2018-11-22

## 2018-11-22 MED ORDER — POLYETHYLENE GLYCOL 3350 17 G PO PACK
17.00 | PACK | ORAL | Status: DC
Start: 2018-11-23 — End: 2018-11-22

## 2018-11-22 MED ORDER — HYDROMORPHONE HCL 1 MG/ML IJ SOLN
0.50 | INTRAMUSCULAR | Status: DC
Start: ? — End: 2018-11-22

## 2018-11-22 MED ORDER — ACETAMINOPHEN 325 MG PO TABS
650.00 | ORAL_TABLET | ORAL | Status: DC
Start: 2018-11-23 — End: 2018-11-22

## 2018-11-22 MED ORDER — SENNOSIDES-DOCUSATE SODIUM 8.6-50 MG PO TABS
2.00 | ORAL_TABLET | ORAL | Status: DC
Start: 2018-11-23 — End: 2018-11-22

## 2018-11-22 MED ORDER — LABETALOL HCL 5 MG/ML IV SOLN
10.00 | INTRAVENOUS | Status: DC
Start: ? — End: 2018-11-22

## 2018-11-22 MED ORDER — GENERIC EXTERNAL MEDICATION
Status: DC
Start: ? — End: 2018-11-22

## 2018-11-22 MED ORDER — NICARDIPINE HCL IN NACL 40-0.83 MG/200ML-% IV SOLN
3.00 | INTRAVENOUS | Status: DC
Start: ? — End: 2018-11-22

## 2018-11-22 MED ORDER — SODIUM CHLORIDE 0.9 % IV SOLN
INTRAVENOUS | Status: DC
Start: ? — End: 2018-11-22

## 2018-11-22 MED ORDER — SENNOSIDES-DOCUSATE SODIUM 8.6-50 MG PO TABS
2.00 | ORAL_TABLET | ORAL | Status: DC
Start: ? — End: 2018-11-22

## 2018-11-22 MED ORDER — NIMODIPINE 30 MG PO CAPS
60.00 | ORAL_CAPSULE | ORAL | Status: DC
Start: 2018-11-23 — End: 2018-11-22

## 2018-11-22 MED ORDER — CITALOPRAM HYDROBROMIDE 20 MG PO TABS
20.00 | ORAL_TABLET | ORAL | Status: DC
Start: 2018-11-23 — End: 2018-11-22

## 2018-11-22 MED ORDER — NALOXONE HCL 0.4 MG/ML IJ SOLN
0.08 | INTRAMUSCULAR | Status: DC
Start: ? — End: 2018-11-22

## 2018-11-22 MED ORDER — ONDANSETRON HCL 4 MG/2ML IJ SOLN
4.00 | INTRAMUSCULAR | Status: DC
Start: ? — End: 2018-11-22

## 2018-11-22 MED ORDER — LIDOCAINE HCL 1 % IJ SOLN
.50 | INTRAMUSCULAR | Status: DC
Start: ? — End: 2018-11-22

## 2018-11-22 MED ORDER — PANTOPRAZOLE SODIUM 40 MG PO TBEC
40.00 | DELAYED_RELEASE_TABLET | ORAL | Status: DC
Start: 2018-11-23 — End: 2018-11-22

## 2018-11-22 MED ORDER — BUTALBITAL-APAP-CAFFEINE 50-325-40 MG PO TABS
1.00 | ORAL_TABLET | ORAL | Status: DC
Start: ? — End: 2018-11-22

## 2018-11-26 DIAGNOSIS — G910 Communicating hydrocephalus: Secondary | ICD-10-CM | POA: Insufficient documentation

## 2018-12-01 DIAGNOSIS — R58 Hemorrhage, not elsewhere classified: Secondary | ICD-10-CM | POA: Insufficient documentation

## 2018-12-23 DIAGNOSIS — H53411 Scotoma involving central area, right eye: Secondary | ICD-10-CM | POA: Insufficient documentation

## 2018-12-23 DIAGNOSIS — Z8669 Personal history of other diseases of the nervous system and sense organs: Secondary | ICD-10-CM | POA: Insufficient documentation

## 2018-12-23 DIAGNOSIS — H47291 Other optic atrophy, right eye: Secondary | ICD-10-CM | POA: Insufficient documentation

## 2019-02-28 ENCOUNTER — Other Ambulatory Visit: Payer: Self-pay | Admitting: Physician Assistant

## 2019-02-28 DIAGNOSIS — D6851 Activated protein C resistance: Secondary | ICD-10-CM

## 2019-02-28 DIAGNOSIS — I8290 Acute embolism and thrombosis of unspecified vein: Secondary | ICD-10-CM

## 2019-03-04 ENCOUNTER — Ambulatory Visit
Admission: RE | Admit: 2019-03-04 | Discharge: 2019-03-04 | Disposition: A | Discharge status: Home / Self Care | Payer: Managed Care, Other (non HMO) | Source: Ambulatory Visit | Attending: Physician Assistant | Admitting: Physician Assistant

## 2019-03-04 ENCOUNTER — Other Ambulatory Visit: Payer: Self-pay

## 2019-03-04 DIAGNOSIS — I8290 Acute embolism and thrombosis of unspecified vein: Secondary | ICD-10-CM | POA: Insufficient documentation

## 2019-03-04 DIAGNOSIS — D6851 Activated protein C resistance: Secondary | ICD-10-CM | POA: Diagnosis present

## 2019-03-10 ENCOUNTER — Other Ambulatory Visit: Payer: Self-pay

## 2019-03-10 ENCOUNTER — Ambulatory Visit (INDEPENDENT_AMBULATORY_CARE_PROVIDER_SITE_OTHER): Payer: Managed Care, Other (non HMO) | Admitting: Nurse Practitioner

## 2019-03-10 ENCOUNTER — Encounter (INDEPENDENT_AMBULATORY_CARE_PROVIDER_SITE_OTHER): Payer: Self-pay | Admitting: Nurse Practitioner

## 2019-03-10 VITALS — BP 144/89 | Temp 89.0°F | Resp 16 | Ht 65.0 in | Wt 274.0 lb

## 2019-03-10 DIAGNOSIS — I83813 Varicose veins of bilateral lower extremities with pain: Secondary | ICD-10-CM

## 2019-03-10 DIAGNOSIS — I10 Essential (primary) hypertension: Secondary | ICD-10-CM | POA: Diagnosis not present

## 2019-03-10 DIAGNOSIS — D6851 Activated protein C resistance: Secondary | ICD-10-CM | POA: Diagnosis not present

## 2019-03-10 DIAGNOSIS — I8290 Acute embolism and thrombosis of unspecified vein: Secondary | ICD-10-CM | POA: Diagnosis not present

## 2019-03-10 MED ORDER — APIXABAN 5 MG PO TABS: 5.0000 mg | ORAL_TABLET | Freq: Two times a day (BID) | ORAL | 1 refills | Status: DC

## 2019-03-13 ENCOUNTER — Encounter (INDEPENDENT_AMBULATORY_CARE_PROVIDER_SITE_OTHER): Payer: Self-pay | Admitting: Nurse Practitioner

## 2019-03-13 NOTE — Progress Notes (Signed)
SUBJECTIVE:  Patient ID: Lisa Barry, female    DOB: 1966-02-28, 53 y.o.   MRN: [id number] Chief Complaint  Patient presents with   Follow-up    venous dvt    HPI  Lisa Barry is a 53 y.o. female that presents today for a second opinion regarding her DVT.  The patient recently had a subarachnoid hemorrhage on 11/21/2018 with EVD placement.  This hemorrhage had an unknown cause.  Was also noted that the patient had a superficial vein thrombosis in her upper extremity which prompted a bilateral lower extremity DVT study which found thrombus within the right common femoral artery on 11/23/2018.  She underwent IVC filter placement on 11/24/2018.  Subsequent CT showed that the patient had bilateral DVTs with bilateral pulmonary embolism all 12/01/2018.  The patient does note that she was diagnosed with factor V Leiden in 2009 and had a DVT around that time and she subsequently has not had one since.  She denies any shortness of breath, leg pain or extreme swelling at this time.  She denies any chest pain.  The patient is mostly anxious following her conversation with the hematologist at [facility].  The patient had her IVC filter removed on 02/21/2019 and it was noted that the filter was removed without perifilter clot.  The patient spoke with her PCP about reimaging her lower extremity due to concern of DVT needs to be present.  It was found that the patient does not have any acute thrombus however is not fully chronic at this time.  The patient continues to be on her Eliquis without issue.  The patient also has concerns with varicose veins of her bilateral lower extremities.  She states that the varicose veins have given her problems for some time however most recently after the DVT she notes that they are worse.  She states that the swelling and pain becomes worse during the day and it becomes difficult for her to do everyday activities such as grocery shopping and doing the dishes.  The patient just  recently began to wear medical grade 1 compression stockings on a daily basis.  She tries to elevate her lower extremities as much as possible with exercise of at least 30 minutes/day. Past Medical History:  Diagnosis Date   Blood clot associated with vein wall inflammation 2008   Factor V Leiden ([place]) 2008   Hypertension     Past Surgical History:  Procedure Laterality Date   ABDOMINAL HYSTERECTOMY     BACK SURGERY     CHOLECYSTECTOMY     LUMBAR PERITONEAL SHUNT  2000    Social History   Socioeconomic History   Marital status: Married    Spouse name: Not on file   Number of children: Not on file   Years of education: Not on file   Highest education level: Not on file  Occupational History   Not on file  Social Needs   Financial resource strain: Not on file   Food insecurity    Worry: Not on file    Inability: Not on file   Transportation needs    Medical: Not on file    Non-medical: Not on file  Tobacco Use   Smoking status: Never Smoker   Smokeless tobacco: Never Used  Substance and Sexual Activity   Alcohol use: Yes    Alcohol/week: 2.0 standard drinks    Types: 2 Glasses of wine per week   Drug use: No   Sexual activity: Never  Lifestyle  Physical activity    Days per week: Not on file    Minutes per session: Not on file   Stress: Not on file  Relationships   Social connections    Talks on phone: Not on file    Gets together: Not on file    Attends religious service: Not on file    Active member of club or organization: Not on file    Attends meetings of clubs or organizations: Not on file    Relationship status: Not on file   Intimate partner violence    Fear of current or ex partner: Not on file    Emotionally abused: Not on file    Physically abused: Not on file    Forced sexual activity: Not on file  Other Topics Concern   Not on file  Social History Narrative   Not on file    Family History  Problem Relation Age  of Onset   Hypertension Mother    CAD Mother    COPD Mother    Bowel Disease Father    Stroke Father    Liver disease Sister     Allergies  Allergen Reactions   Ace Inhibitors Other (See Comments)   Corticosteroids Other (See Comments)    Eye condition states may or may not be the cause Eye condition states may or may not be the cause    Other Other (See Comments)    AVOID STEROIDS DUE TO PREVIOUS EYE CONDITION  BIRTH CONTROL PILLS   Penicillins Other (See Comments)   Sulfa Antibiotics Other (See Comments)    Other reaction(s): Unknown   Tetracycline Other (See Comments)   Tetracyclines & Related     Other reaction(s): Unknown     Review of Systems   Review of Systems: Negative Unless Checked Constitutional: [] Weight loss  [] Fever  [] Chills Cardiac: [] Chest pain   []  Atrial Fibrillation  [] Palpitations   [] Shortness of breath when laying flat   [] Shortness of breath with exertion. [] Shortness of breath at rest Vascular:  [] Pain in legs with walking   [] Pain in legs with standing [] Pain in legs when laying flat   [] Claudication    [] Pain in feet when laying flat    [x] History of DVT   [] Phlebitis   [] Swelling in legs   [x] Varicose veins   [] Non-healing ulcers Pulmonary:   [] Uses home oxygen   [] Productive cough   [] Hemoptysis   [] Wheeze  [] COPD   [] Asthma Neurologic:  [] Dizziness   [] Seizures  [] Blackouts [] History of stroke   [] History of TIA  [] Aphasia   [] Temporary Blindness   [] Weakness or numbness in arm   [] Weakness or numbness in leg Musculoskeletal:   [] Joint swelling   [] Joint pain   [] Low back pain  []  History of Knee Replacement [] Arthritis [x] back Surgeries  []  Spinal Stenosis    Hematologic:  [] Easy bruising  [] Easy bleeding   [x] Hypercoagulable state   [] Anemic Gastrointestinal:  [] Diarrhea   [] Vomiting  [] Gastroesophageal reflux/heartburn   [] Difficulty swallowing. [] Abdominal pain Genitourinary:  [] Chronic kidney disease   [] Difficult urination   [] Anuric   [] Blood in urine [] Frequent urination  [] Burning with urination   [] Hematuria Skin:  [] Rashes   [] Ulcers [] Wounds Psychological:  [] History of anxiety   []  History of major depression  []  Memory Difficulties      OBJECTIVE:   Physical Exam  BP (!) 144/89 (BP Location: Right Arm)    Temp (!) 89 F (31.7 C)    Resp 16  Ht 5\' 5"  (1.651 m)    Wt 274 lb (124.3 kg)    BMI 45.60 kg/m   Gen: WD/WN, NAD Head: [place]/AT, No temporalis wasting.  Ear/Nose/Throat: Hearing grossly intact, nares w/o erythema or drainage Eyes: PER, EOMI, sclera nonicteric.  Neck: Supple, no masses.  No JVD.  Pulmonary:  Good air movement, no use of accessory muscles.  Cardiac: RRR Vascular:  Scattered varicosities bilaterally with 2+ edema bilaterally. Vessel Right Left  Radial Palpable Palpable  Dorsalis Pedis Palpable Palpable  Posterior Tibial Palpable Palpable   Gastrointestinal: soft, non-distended. No guarding/no peritoneal signs.  Musculoskeletal: M/S 5/5 throughout.  No deformity or atrophy.  Neurologic: Pain and light touch intact in extremities.  Symmetrical.  Speech is fluent. Motor exam as listed above. Psychiatric: Judgment intact, Mood & affect appropriate for pt's clinical situation. Dermatologic: No Venous rashes. No Ulcers Noted.  No changes consistent with cellulitis. Lymph : No Cervical lymphadenopathy, no lichenification or skin changes of chronic lymphedema.       ASSESSMENT AND PLAN:  1. Venous thrombosis Patient's noninvasive studies show that her DVT is not acute however not quite chronic.  The patient is highly concerned about the presents of the DVT.  As discussed the patient the treatment guidelines of 6 months of anticoagulation for a DVT in 1 year if the patient should have a PE.  I also discussed the fact that the patient is likely not able to get a PE although the thrombus is still present due to the fact that she is currently anticoagulated.  At this time we will have  the patient continue her Eliquis for 3 months.  I have also sent her an additional prescription for this.  We will have her return in 3 months to evaluate the status of her DVT as well as to evaluate for venous reflux as well due to the patient's persistent varicose veins. - VAS [place] LOWER EXTREMITY VENOUS REFLUX; Future  2. Varicose veins of leg with pain, bilateral  Recommend:  The patient has large symptomatic varicose veins that are painful and associated with swelling.  I have had a long discussion with the patient regarding  varicose veins and why they cause symptoms.  Patient will begin wearing graduated compression stockings class 1 on a daily basis, beginning first thing in the morning and removing them in the evening. The patient is instructed specifically not to sleep in the stockings.    The patient  will also begin using over-the-counter analgesics such as Motrin 600 mg po TID to help control the symptoms.    In addition, behavioral modification including elevation during the day will be initiated.    Pending the results of these changes the  patient will be reevaluated in three months.   An  ultrasound of the venous system will be obtained.   Further plans will be based on the ultrasound results and whether conservative therapies are successful at eliminating the pain and swelling.  - VAS [place] LOWER EXTREMITY VENOUS REFLUX; Future  3. Essential hypertension Continue antihypertensive medications as already ordered, these medications have been reviewed and there are no changes at this time.   4. Factor V Leiden ([place]) The patient does have confirmed heterozygous factor V Leiden disorder.  I discussed with the patient that the genetic mutation does not mean that she will necessarily have more DVTs however she would just be more prone to get them if she undergoes provoking factors such as immobility, hypercoagulability, or trauma.  In these  instances prophylactic Eliquis may be  warranted.  Otherwise additional Eliquis past the treatment for the current DVT is not necessary.   Current Outpatient Medications on File Prior to Visit  Medication Sig Dispense Refill   acetaminophen (TYLENOL) 325 MG tablet SMARTSIG:3 Tablet(s) By Mouth Every 6 Hours     Acyclovir-Hydrocortisone 5-1 % CREA APPLY SMALL AMOUNT TO AFFECTED AREA TWICE A DAY AS NEEDED     ALPRAZolam (XANAX) 0.25 MG tablet Take by mouth.     amLODipine (NORVASC) 10 MG tablet Take 5 mg by mouth.      atorvastatin (LIPITOR) 80 MG tablet Take by mouth.     azelastine (ASTELIN) 0.1 % nasal spray Place into the nose.     citalopram (CELEXA) 20 MG tablet Take by mouth.     fluconazole (DIFLUCAN) 150 MG tablet Take 150 mg by mouth once.     furosemide (LASIX) 20 MG tablet Take by mouth.     hyoscyamine (LEVBID) 0.375 MG 12 hr tablet      Iodoquinol-HC-Aloe Polysacch (ALCORTIN A) 1-2-1 % GEL APPLY A THIN LAYER TO AFFECTED AREA(S) (RASH ON SKIN) 2 - 3 TIMES DAILY UP     levocetirizine (XYZAL) 5 MG tablet Take by mouth.     loratadine (CLARITIN) 10 MG tablet Take by mouth.     metoprolol succinate (TOPROL-XL) 25 MG 24 hr tablet TAKE 1 TABLET BY MOUTH EVERY DAY     METROLOTION 0.75 % LOTN SMARTSIG:1 Sparingly Topical 1 to 2 Times Daily     omeprazole (PRILOSEC) 40 MG capsule Take by mouth.     ondansetron (ZOFRAN) 4 MG tablet Take by mouth.     potassium chloride (K-DUR) 10 MEQ tablet Take by mouth.     valACYclovir (VALTREX) 500 MG tablet Take by mouth.     zolpidem (AMBIEN) 5 MG tablet TAKE 1 TABLET BY MOUTH NIGHTLY AS NEEDED FOR SLEEP     valsartan-hydrochlorothiazide (DIOVAN-HCT) 320-12.5 MG tablet Take by mouth.     No current facility-administered medications on file prior to visit.     There are no Patient Instructions on file for this visit. No follow-ups on file.   Kris Hartmann, NP  This note was completed with Sales executive.  Any errors are purely unintentional.

## 2019-04-11 ENCOUNTER — Ambulatory Visit
Admission: RE | Admit: 2019-04-11 | Discharge: 2019-04-11 | Disposition: A | Discharge status: Home / Self Care | Payer: Managed Care, Other (non HMO) | Source: Ambulatory Visit | Attending: Physician Assistant | Admitting: Physician Assistant

## 2019-04-11 DIAGNOSIS — Z1231 Encounter for screening mammogram for malignant neoplasm of breast: Secondary | ICD-10-CM | POA: Insufficient documentation

## 2019-05-25 ENCOUNTER — Ambulatory Visit (INDEPENDENT_AMBULATORY_CARE_PROVIDER_SITE_OTHER): Payer: Managed Care, Other (non HMO) | Admitting: Nurse Practitioner

## 2019-05-25 ENCOUNTER — Encounter (INDEPENDENT_AMBULATORY_CARE_PROVIDER_SITE_OTHER): Payer: Managed Care, Other (non HMO)

## 2019-06-08 ENCOUNTER — Encounter (INDEPENDENT_AMBULATORY_CARE_PROVIDER_SITE_OTHER): Payer: Self-pay

## 2019-06-08 ENCOUNTER — Ambulatory Visit (INDEPENDENT_AMBULATORY_CARE_PROVIDER_SITE_OTHER): Payer: Managed Care, Other (non HMO)

## 2019-06-08 ENCOUNTER — Other Ambulatory Visit: Payer: Self-pay

## 2019-06-08 ENCOUNTER — Ambulatory Visit (INDEPENDENT_AMBULATORY_CARE_PROVIDER_SITE_OTHER): Payer: Managed Care, Other (non HMO) | Admitting: Nurse Practitioner

## 2019-06-08 ENCOUNTER — Encounter (INDEPENDENT_AMBULATORY_CARE_PROVIDER_SITE_OTHER): Payer: Self-pay | Admitting: Nurse Practitioner

## 2019-06-08 VITALS — BP 132/83 | HR 81 | Resp 16 | Wt 281.0 lb

## 2019-06-08 DIAGNOSIS — I83813 Varicose veins of bilateral lower extremities with pain: Secondary | ICD-10-CM | POA: Diagnosis not present

## 2019-06-08 DIAGNOSIS — I8290 Acute embolism and thrombosis of unspecified vein: Secondary | ICD-10-CM | POA: Diagnosis not present

## 2019-06-08 DIAGNOSIS — I890 Lymphedema, not elsewhere classified: Secondary | ICD-10-CM

## 2019-06-08 DIAGNOSIS — Z6841 Body Mass Index (BMI) 40.0 and over, adult: Secondary | ICD-10-CM

## 2019-06-08 MED ORDER — APIXABAN 5 MG PO TABS: 5.0000 mg | ORAL_TABLET | Freq: Two times a day (BID) | ORAL | 1 refills | Status: DC

## 2019-06-10 ENCOUNTER — Encounter (INDEPENDENT_AMBULATORY_CARE_PROVIDER_SITE_OTHER): Payer: Self-pay | Admitting: Nurse Practitioner

## 2019-06-10 NOTE — Progress Notes (Signed)
SUBJECTIVE:  Patient ID: Lisa Barry, female    DOB: Jun 21, 1965, 54 y.o.   MRN: [id number] Chief Complaint  Patient presents with  . Follow-up    ultrasound follow up    HPI  Lisa Barry is a 54 y.o. female returns to noninvasive studies regarding her DVT.  The patient does have noted factor V Leiden.  The patient had a bilateral pulmonary embolism on 12/01/2018.  Her DVT filter was removed on 02/21/2019.  The patient's greatest concern was that the blood clots were not fully resolved.  At last visit the patient didn't have any acute thrombus however it was not fully chronic at that time.  She was also concerned about the varicose veins in her lower extremities.  The patient is also greatly concerned about the swelling in her lower extremities.  The patient states that her mother had to wear compression stockings on a daily basis and had issues with swelling later on in life.  She is concerned about that for self as well.  She denies any fever, chills, nausea, vomiting or diarrhea.  She denies any chest pain or shortness of breath.    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  The left has findings consistent with chronic DVT in the left femoral vein, and the proximal profunda.  The great saphenous vein was not visualized as per a history of of ablation.  The patient has partial compressibility of the popliteal to femoral vein in the right lower extremity.  The patient also has evidence of reflux throughout her right lower extremity at the popliteal vein as well as in the great saphenous vein starting at the saphenofemoral junction extending to the knee.  Past Medical History:  Diagnosis Date  . Blood clot associated with vein wall inflammation 2008  . Factor V Leiden ([place]) 2008  . Hypertension     Past Surgical History:  Procedure Laterality Date  . ABDOMINAL HYSTERECTOMY      . BACK SURGERY    . CHOLECYSTECTOMY    . LUMBAR PERITONEAL SHUNT  2000    Social History   Socioeconomic History  . Marital status: Married    Spouse name: Not on file  . Number of children: Not on file  . Years of education: Not on file  . Highest education level: Not on file  Occupational History  . Not on file  Tobacco Use  . Smoking status: Never Smoker  . Smokeless tobacco: Never Used  Substance and Sexual Activity  . Alcohol use: Yes    Alcohol/week: 2.0 standard drinks    Types: 2 Glasses of wine per week  . Drug use: No  . Sexual activity: Never  Other Topics Concern  . Not on file  Social History Narrative  . Not on file   Social Determinants of Health   Financial Resource Strain:   . Difficulty of Paying Living Expenses: Not on file  Food Insecurity:   . Worried About Charity fundraiser in the Last Year: Not on file  . Ran Out of Food in the Last Year: Not on file  Transportation Needs:   . Lack of Transportation (Medical): Not on file  . Lack of Transportation (Non-Medical): Not on file  Physical Activity:   . Days of Exercise per Week: Not on file  . Minutes of Exercise per Session: Not on file  Stress:   .  Feeling of Stress : Not on file  Social Connections:   . Frequency of Communication with Friends and Family: Not on file  . Frequency of Social Gatherings with Friends and Family: Not on file  . Attends Religious Services: Not on file  . Active Member of Clubs or Organizations: Not on file  . Attends Archivist Meetings: Not on file  . Marital Status: Not on file  Intimate Partner Violence:   . Fear of Current or Ex-Partner: Not on file  . Emotionally Abused: Not on file  . Physically Abused: Not on file  . Sexually Abused: Not on file    Family History  Problem Relation Age of Onset  . Hypertension Mother   . CAD Mother   . COPD Mother   . Bowel Disease Father   . Stroke Father   . Liver disease Sister     Allergies   Allergen Reactions  . Ace Inhibitors Other (See Comments)  . Corticosteroids Other (See Comments)    Eye condition states may or may not be the cause Eye condition states may or may not be the cause   . Other Other (See Comments)    AVOID STEROIDS DUE TO PREVIOUS EYE CONDITION  BIRTH CONTROL PILLS  . Penicillins Other (See Comments)  . Sulfa Antibiotics Other (See Comments)    Other reaction(s): Unknown  . Tetracycline Other (See Comments)  . Tetracyclines & Related     Other reaction(s): Unknown     Review of Systems   Review of Systems: Negative Unless Checked Constitutional: [] Weight loss  [] Fever  [] Chills Cardiac: [] Chest pain   []  Atrial Fibrillation  [] Palpitations   [] Shortness of breath when laying flat   [] Shortness of breath with exertion. [] Shortness of breath at rest Vascular:  [] Pain in legs with walking   [] Pain in legs with standing [] Pain in legs when laying flat   [] Claudication    [] Pain in feet when laying flat    [x] History of DVT   [] Phlebitis   [x] Swelling in legs   [x] Varicose veins   [] Non-healing ulcers Pulmonary:   [] Uses home oxygen   [] Productive cough   [] Hemoptysis   [] Wheeze  [] COPD   [] Asthma Neurologic:  [] Dizziness   [] Seizures  [] Blackouts [] History of stroke   [] History of TIA  [] Aphasia   [] Temporary Blindness   [] Weakness or numbness in arm   [] Weakness or numbness in leg Musculoskeletal:   [] Joint swelling   [] Joint pain   [] Low back pain  []  History of Knee Replacement [] Arthritis [] back Surgeries  []  Spinal Stenosis    Hematologic:  [] Easy bruising  [] Easy bleeding   [x] Hypercoagulable state   [] Anemic Gastrointestinal:  [] Diarrhea   [] Vomiting  [] Gastroesophageal reflux/heartburn   [] Difficulty swallowing. [] Abdominal pain Genitourinary:  [] Chronic kidney disease   [] Difficult urination  [] Anuric   [] Blood in urine [] Frequent urination  [] Burning with urination   [] Hematuria Skin:  [] Rashes   [] Ulcers [] Wounds Psychological:  [] History of  anxiety   []  History of major depression  []  Memory Difficulties      OBJECTIVE:   Physical Exam  BP 132/83 (BP Location: Right Arm)   Pulse 81   Resp 16   Wt 281 lb (127.5 kg)   BMI 46.76 kg/m   Gen: WD/WN, NAD Head: [place]/AT, No temporalis wasting.  Ear/Nose/Throat: Hearing grossly intact, nares w/o erythema or drainage Eyes: PER, EOMI, sclera nonicteric.  Neck: Supple, no masses.  No JVD.  Pulmonary:  Good air movement,  no use of accessory muscles.  Cardiac: RRR Vascular:  2+ edema bilaterally Vessel Right Left  Radial Palpable Palpable  Dorsalis Pedis Palpable Palpable  Posterior Tibial Palpable Palpable   Gastrointestinal: soft, non-distended. No guarding/no peritoneal signs.  Musculoskeletal: M/S 5/5 throughout.  No deformity or atrophy.  Neurologic: Pain and light touch intact in extremities.  Symmetrical.  Speech is fluent. Motor exam as listed above. Psychiatric: Judgment intact, Mood & affect appropriate for pt's clinical situation. Dermatologic:  Mild stasis dermatitis.  No Ulcers Noted.  No changes consistent with cellulitis. Lymph : No Cervical lymphadenopathy, no lichenification or skin changes of chronic lymphedema.       ASSESSMENT AND PLAN:  1. Venous thrombosis Patient continues to have chronic thrombus bilaterally.  Had a very long discussion with the patient regarding the difference between a chronic thrombus versus an acute thrombus.  The patient still has some significant anxiety regarding this and given the fact that she had bilateral pulmonary embolisms it would not be unreasonable to continue anticoagulation at this time.  Patient is also extremely anxious that the chronic DVT would pose a risk for pulmonary embolism however we extensively discussed how this is more consistent with a scar tissue and it by itself will not cause any new pulmonary embolisms.  We will reevaluate her chronic thrombus status at her follow-up visit.  2. Varicose veins of leg  with pain, bilateral Patient has evidence of reflux throughout her right lower extremity.  However, the patient still has evidence of partial compressibility through her deep femoral system.  I had a very long discussion with the patient regarding the endovenous laser procedure as well as benefits and possible downsides.  Based on the fact that the patient currently has some partial compressibility within her deep system that she wait on proceeding with any sort of endovenous laser procedure and continue with conservative therapy at this time.  Patient is agreeable return to the office for reevaluation in 3 months.  3. Morbid obesity with BMI of 40.0-44.9, adult (HCC) Excessive weight will likely contribute to edema.  Weight loss would also help with swelling.  4. Lymphedema I long discussion with the patient regarding her greatest concern of leg swelling.  The patient likely does have some component of lymphedema bilaterally given that she has had swelling for an extended period of time which is likely cause some lymphatic scarring in addition to the bilateral DVTs as well.  Typically lymphedema is best controlled with consistent compression therapy.  I discussed with the patient wearing medical grade 1 compression stockings on a daily basis, prescription was also given, elevation was stressed in addition to daily exercise.  We also discussed the possibility of a lymph pump however we will reevaluate the patient in 3 months to determine effectiveness of conservative therapy before we proceed with lymph pump.   Current Outpatient Medications on File Prior to Visit  Medication Sig Dispense Refill  . acetaminophen (TYLENOL) 325 MG tablet SMARTSIG:3 Tablet(s) By Mouth Every 6 Hours    . Acyclovir-Hydrocortisone 5-1 % CREA APPLY SMALL AMOUNT TO AFFECTED AREA TWICE A DAY AS NEEDED    . ALPRAZolam (XANAX) 0.25 MG tablet Take by mouth.    Marland Kitchen amLODipine (NORVASC) 10 MG tablet Take 5 mg by mouth.     Marland Kitchen  atorvastatin (LIPITOR) 80 MG tablet Take by mouth.    . citalopram (CELEXA) 20 MG tablet Take by mouth.    . fluconazole (DIFLUCAN) 150 MG tablet Take 150 mg by  mouth once.    . furosemide (LASIX) 20 MG tablet Take by mouth.    . hyoscyamine (LEVBID) 0.375 MG 12 hr tablet     . Iodoquinol-HC-Aloe Polysacch (ALCORTIN A) 1-2-1 % GEL APPLY A THIN LAYER TO AFFECTED AREA(S) (RASH ON SKIN) 2 - 3 TIMES DAILY UP    . loratadine (CLARITIN) 10 MG tablet Take by mouth.    . metoprolol succinate (TOPROL-XL) 25 MG 24 hr tablet TAKE 1 TABLET BY MOUTH EVERY DAY    . METROLOTION 0.75 % LOTN SMARTSIG:1 Sparingly Topical 1 to 2 Times Daily    . omeprazole (PRILOSEC) 40 MG capsule Take by mouth.    . ondansetron (ZOFRAN) 4 MG tablet Take by mouth.    . potassium chloride (K-DUR) 10 MEQ tablet Take by mouth as needed.     . valACYclovir (VALTREX) 500 MG tablet Take by mouth.    . zolpidem (AMBIEN) 5 MG tablet TAKE 1 TABLET BY MOUTH NIGHTLY AS NEEDED FOR SLEEP    . azelastine (ASTELIN) 0.1 % nasal spray Place into the nose.    . levocetirizine (XYZAL) 5 MG tablet Take by mouth as needed.     . valsartan-hydrochlorothiazide (DIOVAN-HCT) 320-12.5 MG tablet Take by mouth.     No current facility-administered medications on file prior to visit.    There are no Patient Instructions on file for this visit. No follow-ups on file.   Kris Hartmann, NP  This note was completed with Sales executive.  Any errors are purely unintentional.

## 2019-07-01 ENCOUNTER — Other Ambulatory Visit: Payer: Self-pay | Admitting: Physician Assistant

## 2019-07-01 ENCOUNTER — Ambulatory Visit
Admission: RE | Admit: 2019-07-01 | Discharge: 2019-07-01 | Disposition: A | Discharge status: Home / Self Care | Payer: Managed Care, Other (non HMO) | Source: Ambulatory Visit | Attending: Physician Assistant | Admitting: Physician Assistant

## 2019-07-01 ENCOUNTER — Other Ambulatory Visit: Payer: Self-pay

## 2019-07-01 DIAGNOSIS — R109 Unspecified abdominal pain: Secondary | ICD-10-CM | POA: Diagnosis not present

## 2019-07-26 ENCOUNTER — Encounter: Payer: Self-pay | Admitting: Dermatology

## 2019-07-26 ENCOUNTER — Other Ambulatory Visit: Payer: Self-pay

## 2019-07-26 ENCOUNTER — Ambulatory Visit: Payer: Managed Care, Other (non HMO) | Admitting: Dermatology

## 2019-07-26 DIAGNOSIS — L0100 Impetigo, unspecified: Secondary | ICD-10-CM

## 2019-07-26 NOTE — Progress Notes (Signed)
   Follow-Up Visit   Subjective  Lisa Barry is a 54 y.o. female who presents for the following: Follow-up (Possible impetigo nose).  The following portions of the chart were reviewed this encounter and updated as appropriate:     Review of Systems: No other skin or systemic complaints.  Objective  Well appearing patient in no apparent distress; mood and affect are within normal limits.  A focused examination was performed including Nose, Face. Relevant physical exam findings are noted in the Assessment and Plan.  Objective  Right Tip of Nose:  27mm Light pink slightly depressed macule no crust or scale, no induration, Photo today  Assessment & Plan  Impetigo Right Tip of Nose  Resolved.  D/C Mupirocin ointment . Observe for changes. Will recheck on f/up  Return in about 3 months (around 10/26/2019) for 2 to 3 months.

## 2019-09-02 ENCOUNTER — Other Ambulatory Visit (INDEPENDENT_AMBULATORY_CARE_PROVIDER_SITE_OTHER): Payer: Self-pay | Admitting: Nurse Practitioner

## 2019-09-02 DIAGNOSIS — I872 Venous insufficiency (chronic) (peripheral): Secondary | ICD-10-CM

## 2019-09-06 ENCOUNTER — Ambulatory Visit (INDEPENDENT_AMBULATORY_CARE_PROVIDER_SITE_OTHER): Payer: Managed Care, Other (non HMO)

## 2019-09-06 ENCOUNTER — Ambulatory Visit (INDEPENDENT_AMBULATORY_CARE_PROVIDER_SITE_OTHER): Payer: Managed Care, Other (non HMO) | Admitting: Nurse Practitioner

## 2019-09-06 ENCOUNTER — Encounter (INDEPENDENT_AMBULATORY_CARE_PROVIDER_SITE_OTHER): Payer: Self-pay | Admitting: Nurse Practitioner

## 2019-09-06 ENCOUNTER — Other Ambulatory Visit: Payer: Self-pay

## 2019-09-06 VITALS — BP 146/87 | HR 86 | Resp 16 | Wt 292.4 lb

## 2019-09-06 DIAGNOSIS — I872 Venous insufficiency (chronic) (peripheral): Secondary | ICD-10-CM | POA: Diagnosis not present

## 2019-09-06 DIAGNOSIS — D6851 Activated protein C resistance: Secondary | ICD-10-CM

## 2019-09-06 DIAGNOSIS — I83813 Varicose veins of bilateral lower extremities with pain: Secondary | ICD-10-CM | POA: Diagnosis not present

## 2019-09-06 DIAGNOSIS — I10 Essential (primary) hypertension: Secondary | ICD-10-CM

## 2019-09-09 ENCOUNTER — Encounter (INDEPENDENT_AMBULATORY_CARE_PROVIDER_SITE_OTHER): Payer: Self-pay | Admitting: Nurse Practitioner

## 2019-09-09 NOTE — Progress Notes (Signed)
Subjective:    Patient ID: Lisa Barry, female    DOB: 1966/03/25, 54 y.o.   MRN: [id number] Chief Complaint  Patient presents with  . Follow-up    ultrasound follow up    Lisa Barry is a 54 y.o. female returns to noninvasive studies regarding her DVT.  The patient does have noted factor V Leiden.  The patient had a bilateral DVT on 12/01/2018.  Her IVC filter was removed on 02/21/2019.  Today we had the patient return to inquire about her conservative therapy regarding her leg swelling her varicose veins.  The swelling has been much better controlled with use of her compression stockings however she still continues to have pain and discomfort with her varicose veins.  She was also concerned about the varicose veins in her lower extremities.  The patient is also greatly concerned about the swelling in her lower extremities.  The patient states that her mother had to wear compression stockings on a daily basis and had issues with swelling later on in life.  She is concerned about that for self as well.  She denies any fever, chills, nausea, vomiting or diarrhea.  She denies any chest pain or shortness of breath.    Today noninvasive studies show that the patient continues to have nonocclusive thrombus within the right SFA with some wall thickening of the right popliteal vein.  This is a chronic DVT.  The patient also has evidence of deep venous reflux in the common femoral vein, femoral vein as well as popliteal vein.  The patient has evidence of reflux in the great saphenous vein at the saphenofemoral junction extending down to the knee.  The patient also has reflux in her small saphenous vein.  The vein diameters range from 0.63 cm to 1.06.  The small saphenous vein diameter is 0.17.   Review of Systems  Respiratory: Negative for shortness of breath.   Cardiovascular: Positive for leg swelling.  All other systems reviewed and are negative.      Objective:   Physical Exam Vitals  reviewed.  Constitutional:      Appearance: Normal appearance. She is obese.  HENT:     Head: Normocephalic.  Cardiovascular:     Rate and Rhythm: Normal rate and regular rhythm.     Pulses: Normal pulses.     Heart sounds: Normal heart sounds.     Comments: Scattered spider veins bilaterally Pulmonary:     Effort: Pulmonary effort is normal.     Breath sounds: Normal breath sounds.  Neurological:     Mental Status: She is alert and oriented to person, place, and time.  Psychiatric:        Mood and Affect: Mood normal.        Behavior: Behavior normal.        Thought Content: Thought content normal.        Judgment: Judgment normal.     BP (!) 146/87 (BP Location: Right Arm)   Pulse 86   Resp 16   Wt 292 lb 6.4 oz (132.6 kg)   BMI 48.66 kg/m   Past Medical History:  Diagnosis Date  . Blood clot associated with vein wall inflammation 2008  . Factor V Leiden ([place]) 2008  . Hypertension     Social History   Socioeconomic History  . Marital status: Married    Spouse name: Not on file  . Number of children: Not on file  . Years of education: Not on file  .  Highest education level: Not on file  Occupational History  . Not on file  Tobacco Use  . Smoking status: Never Smoker  . Smokeless tobacco: Never Used  Substance and Sexual Activity  . Alcohol use: Yes    Alcohol/week: 2.0 standard drinks    Types: 2 Glasses of wine per week  . Drug use: No  . Sexual activity: Never  Other Topics Concern  . Not on file  Social History Narrative  . Not on file   Social Determinants of Health   Financial Resource Strain:   . Difficulty of Paying Living Expenses:   Food Insecurity:   . Worried About Charity fundraiser in the Last Year:   . Arboriculturist in the Last Year:   Transportation Needs:   . Film/video editor (Medical):   Marland Kitchen Lack of Transportation (Non-Medical):   Physical Activity:   . Days of Exercise per Week:   . Minutes of Exercise per Session:     Stress:   . Feeling of Stress :   Social Connections:   . Frequency of Communication with Friends and Family:   . Frequency of Social Gatherings with Friends and Family:   . Attends Religious Services:   . Active Member of Clubs or Organizations:   . Attends Archivist Meetings:   Marland Kitchen Marital Status:   Intimate Partner Violence:   . Fear of Current or Ex-Partner:   . Emotionally Abused:   Marland Kitchen Physically Abused:   . Sexually Abused:     Past Surgical History:  Procedure Laterality Date  . ABDOMINAL HYSTERECTOMY    . BACK SURGERY    . CHOLECYSTECTOMY    . LUMBAR PERITONEAL SHUNT  2000    Family History  Problem Relation Age of Onset  . Hypertension Mother   . CAD Mother   . COPD Mother   . Bowel Disease Father   . Stroke Father   . Liver disease Sister     Allergies  Allergen Reactions  . Ace Inhibitors Other (See Comments)  . Corticosteroids Other (See Comments)    Eye condition states may or may not be the cause Eye condition states may or may not be the cause   . Other Other (See Comments)    AVOID STEROIDS DUE TO PREVIOUS EYE CONDITION  BIRTH CONTROL PILLS  . Penicillins Other (See Comments)  . Sulfa Antibiotics Other (See Comments)    Other reaction(s): Unknown  . Tetracycline Other (See Comments)  . Tetracyclines & Related     Other reaction(s): Unknown       Assessment & Plan:   1. Varicose veins of leg with pain, bilateral Recommend:  The patient has had bilateral DVTs with remaining chronic thrombus and history of factor V Leiden.  This makes endovenous ablation potentially impossible however the patient still has persistent symptoms of pain and swelling that are having a negative impact on daily life and daily activities.  Patient should undergo injection sclerotherapy to treat the residual varicosities.  The risks, benefits and alternative therapies were reviewed in detail with the patient.  All questions were answered.  The patient  agrees to proceed with sclerotherapy at their convenience.  The patient will continue wearing the graduated compression stockings and using the over-the-counter pain medications to treat her symptoms.       2. Factor V Leiden ([place]) Venous ablation of her great saphenous vein of the right lower extremity is concerning due to her history of  factor V Leiden as well as extensive DVTs bilaterally.  The patient continues to have evidence of chronic but nonocclusive thrombus in the right SFA.  The concern is that ablation of the great saphenous vein may place the patient at risk for severe complications should she ever develop another DVT in her right lower extremity.  The patient will remain on anticoagulation at this time.  3. Essential hypertension Continue antihypertensive medications as already ordered, these medications have been reviewed and there are no changes at this time.    Current Outpatient Medications on File Prior to Visit  Medication Sig Dispense Refill  . acetaminophen (TYLENOL) 325 MG tablet SMARTSIG:3 Tablet(s) By Mouth Every 6 Hours    . Acyclovir-Hydrocortisone 5-1 % CREA APPLY SMALL AMOUNT TO AFFECTED AREA TWICE A DAY AS NEEDED    . ALPRAZolam (XANAX) 0.25 MG tablet Take by mouth.    Marland Kitchen amLODipine (NORVASC) 10 MG tablet Take 5 mg by mouth.     Marland Kitchen apixaban (ELIQUIS) 5 MG TABS tablet Take 1 tablet (5 mg total) by mouth 2 (two) times daily. 180 tablet 1  . atorvastatin (LIPITOR) 80 MG tablet Take by mouth.    . citalopram (CELEXA) 20 MG tablet Take by mouth.    . fluconazole (DIFLUCAN) 150 MG tablet Take 150 mg by mouth once.    . furosemide (LASIX) 20 MG tablet Take by mouth.    . hyoscyamine (LEVBID) 0.375 MG 12 hr tablet     . Iodoquinol-HC-Aloe Polysacch (ALCORTIN A) 1-2-1 % GEL APPLY A THIN LAYER TO AFFECTED AREA(S) (RASH ON SKIN) 2 - 3 TIMES DAILY UP    . loratadine (CLARITIN) 10 MG tablet Take by mouth.    . metoprolol succinate (TOPROL-XL) 25 MG 24 hr tablet TAKE 1  TABLET BY MOUTH EVERY DAY    . METROLOTION 0.75 % LOTN SMARTSIG:1 Sparingly Topical 1 to 2 Times Daily    . omeprazole (PRILOSEC) 40 MG capsule Take by mouth.    . ondansetron (ZOFRAN) 4 MG tablet Take by mouth.    . potassium chloride (K-DUR) 10 MEQ tablet Take by mouth as needed.     . valACYclovir (VALTREX) 500 MG tablet Take by mouth.    . zolpidem (AMBIEN) 5 MG tablet TAKE 1 TABLET BY MOUTH NIGHTLY AS NEEDED FOR SLEEP    . azelastine (ASTELIN) 0.1 % nasal spray Place into the nose.    . levocetirizine (XYZAL) 5 MG tablet Take by mouth as needed.     . valsartan-hydrochlorothiazide (DIOVAN-HCT) 320-12.5 MG tablet Take by mouth.     No current facility-administered medications on file prior to visit.    There are no Patient Instructions on file for this visit. No follow-ups on file.   Kris Hartmann, NP

## 2019-10-05 ENCOUNTER — Ambulatory Visit: Payer: Managed Care, Other (non HMO) | Admitting: Urology

## 2019-10-18 ENCOUNTER — Ambulatory Visit: Payer: Managed Care, Other (non HMO) | Admitting: Urology

## 2019-11-01 ENCOUNTER — Ambulatory Visit: Payer: Managed Care, Other (non HMO) | Admitting: Dermatology

## 2019-11-12 NOTE — Progress Notes (Signed)
11/15/19 9:52 AM   [name] [birth year] [id number]  Referring provider: Marinda Elk, MD [facility]  [place] [address] Chief Complaint  Patient presents with  . Urinary Incontinence    New Patient    HPI: Lisa Barry is a 54 y.o. female who presents today for evaluation and management of urinary incontinence.   In 11/21/2018 the patient had a stroke and has noticed some leakage which has improved some. She is unable to hold her urine at times and will have accidents. In the mornings she can she leakage in her underwear. She has nocturia x 2. She states that she does urinate a lot at baseline. She denies stress incontinence.   She is on Lasix as needed.   She reports having IBS. Her loose stool is well managed by hyoscyamine.  Denies any vaginal pressure/ bulging.  She has never been on any OAB medication.   Urinalysis at [facility] on 06/27/2019 showed trace amount of blood. Urinalysis on 09/09/2019 showed rare bacteria and associated urine culture was normal.    Renal CT on 07/01/2019 revealed no findings to account for reported symptoms. Specifically, no hydronephrosis or urinary tract calculi.  She likes to drink diet Dr. Malachi Bonds.   Patient is actively trying to lose weight.   PMH: Past Medical History:  Diagnosis Date  . Blood clot associated with vein wall inflammation 2008  . Factor V Leiden ([place]) 2008  . Hypertension   . TIA (transient ischemic attack) 11/2018    Surgical History: Past Surgical History:  Procedure Laterality Date  . ABDOMINAL HYSTERECTOMY    . BACK SURGERY    . CHOLECYSTECTOMY    . LUMBAR PERITONEAL SHUNT  2000    Home Medications:  Allergies as of 11/15/2019      Reactions   Ace Inhibitors Other (See Comments)   Corticosteroids Other (See Comments)   Eye condition states may or may not be the cause Eye condition states may or may not be the cause   Other Other (See Comments)   AVOID  STEROIDS DUE TO PREVIOUS EYE CONDITION BIRTH CONTROL PILLS   Penicillins Other (See Comments)   Sulfa Antibiotics Other (See Comments)   Other reaction(s): Unknown   Tetracycline Other (See Comments)   Tetracyclines & Related    Other reaction(s): Unknown      Medication List       Accurate as of November 15, 2019  9:52 AM. If you have any questions, ask your nurse or doctor.        STOP taking these medications   fluconazole 150 MG tablet Commonly known as: DIFLUCAN Stopped by: Hollice Espy, MD   MetroLotion 0.75 % Lotn Generic drug: METRONIDAZOLE (TOPICAL) Stopped by: Hollice Espy, MD   ondansetron 4 MG tablet Commonly known as: ZOFRAN Stopped by: Hollice Espy, MD     TAKE these medications   acetaminophen 325 MG tablet Commonly known as: TYLENOL SMARTSIG:3 Tablet(s) By Mouth Every 6 Hours   Acyclovir-Hydrocortisone 5-1 % Crea APPLY SMALL AMOUNT TO AFFECTED AREA TWICE A DAY AS NEEDED   Alcortin A 1-2-1 % Gel APPLY A THIN LAYER TO AFFECTED AREA(S) (RASH ON SKIN) 2 - 3 TIMES DAILY UP   ALPRAZolam 0.25 MG tablet Commonly known as: XANAX Take by mouth.   ALPRAZolam 0.5 MG tablet Commonly known as: XANAX Take 0.5 mg by mouth daily as needed.   amLODipine 10 MG tablet Commonly known as: NORVASC Take 5 mg  by mouth.   apixaban 5 MG Tabs tablet Commonly known as: ELIQUIS Take 1 tablet (5 mg total) by mouth 2 (two) times daily.   atorvastatin 80 MG tablet Commonly known as: LIPITOR Take by mouth.   azelastine 0.1 % nasal spray Commonly known as: ASTELIN Place into the nose.   citalopram 20 MG tablet Commonly known as: CELEXA Take by mouth.   furosemide 20 MG tablet Commonly known as: LASIX Take by mouth.   hyoscyamine 0.375 MG 12 hr tablet Commonly known as: LEVBID   levocetirizine 5 MG tablet Commonly known as: XYZAL Take by mouth as needed.   loratadine 10 MG tablet Commonly known as: CLARITIN Take by mouth.   metoprolol succinate 25 MG  24 hr tablet Commonly known as: TOPROL-XL TAKE 1 TABLET BY MOUTH EVERY DAY   omeprazole 40 MG capsule Commonly known as: PRILOSEC Take by mouth.   oxybutynin 15 MG 24 hr tablet Commonly known as: DITROPAN XL Take 1 tablet (15 mg total) by mouth daily. Started by: Hollice Espy, MD   potassium chloride 10 MEQ tablet Commonly known as: KLOR-CON Take by mouth as needed.   valACYclovir 500 MG tablet Commonly known as: VALTREX Take by mouth.   valsartan-hydrochlorothiazide 320-12.5 MG tablet Commonly known as: DIOVAN-HCT Take by mouth.   zolpidem 5 MG tablet Commonly known as: AMBIEN TAKE 1 TABLET BY MOUTH NIGHTLY AS NEEDED FOR SLEEP       Allergies:  Allergies  Allergen Reactions  . Ace Inhibitors Other (See Comments)  . Corticosteroids Other (See Comments)    Eye condition states may or may not be the cause Eye condition states may or may not be the cause   . Other Other (See Comments)    AVOID STEROIDS DUE TO PREVIOUS EYE CONDITION  BIRTH CONTROL PILLS  . Penicillins Other (See Comments)  . Sulfa Antibiotics Other (See Comments)    Other reaction(s): Unknown  . Tetracycline Other (See Comments)  . Tetracyclines & Related     Other reaction(s): Unknown    Family History: Family History  Problem Relation Age of Onset  . Hypertension Mother   . CAD Mother   . COPD Mother   . Bowel Disease Father   . Stroke Father   . Liver disease Sister   . Bladder Cancer Neg Hx   . Kidney cancer Neg Hx   . Prostate cancer Neg Hx     Social History:  reports that she has never smoked. She has never used smokeless tobacco. She reports current alcohol use of about 2.0 standard drinks of alcohol per week. She reports that she does not use drugs.   Physical Exam: BP (!) 142/94   Pulse 96   Ht 5\' 5"  (1.651 m)   Wt 295 lb (133.8 kg)   BMI 49.09 kg/m   Constitutional:  Alert and oriented, No acute distress.  Morbidly obese. HEENT: [place] AT, moist mucus membranes.   Trachea midline, no masses. Cardiovascular: No clubbing, cyanosis, or edema. Respiratory: Normal respiratory effort, no increased work of breathing. Skin: No rashes, bruises or suspicious lesions. Neurologic: Grossly intact, no focal deficits, moving all 4 extremities. Psychiatric: Normal mood and affect. Extremities: Bilateral lower extremity edema appreciated   Urinalysis Negative   Pertinent Imaging: Results for orders placed or performed in visit on 11/15/19  BLADDER SCAN AMB NON-IMAGING  Result Value Ref Range   Scan Result 15ml      Assessment & Plan:    1. OAB (overactive bladder)/  Urge incontinence History of stroke in 11/21/2018 which suspect is a contributing factor to her new onset symptoms, improving  No concerns for overflow incontinence   Unclear if she has any stress incontinence but based on history today, seems like most of her symptoms are related to urge and urge incontinence  Behavorial modifications were discussed including weight loss, cutting back on Dr. Malachi Bonds, etc.  She was provided with the book bladder matters for educational purposes.  Trial of 15 mg oxybutynin prescribed.   No evidence of infection is contributing factor.  Urine is reassuring today.   F/u 3 months with PA for symptoms recheck.    [facility] [address], [place], [place] [address] [phone]  I, Selena Batten, am acting as a scribe for Dr. Hollice Espy.   I have reviewed the above documentation for accuracy and completeness, and I agree with the above.   Hollice Espy, MD   I spent 45 total minutes on the day of the encounter including pre-visit review of the medical record, face-to-face time with the patient, and post visit ordering of labs/imaging/tests.

## 2019-11-15 ENCOUNTER — Ambulatory Visit: Payer: Managed Care, Other (non HMO) | Admitting: Urology

## 2019-11-15 ENCOUNTER — Other Ambulatory Visit: Payer: Self-pay

## 2019-11-15 ENCOUNTER — Encounter: Payer: Self-pay | Admitting: Urology

## 2019-11-15 VITALS — BP 142/94 | HR 96 | Ht 65.0 in | Wt 295.0 lb

## 2019-11-15 DIAGNOSIS — R32 Unspecified urinary incontinence: Secondary | ICD-10-CM | POA: Diagnosis not present

## 2019-11-15 LAB — BLADDER SCAN AMB NON-IMAGING

## 2019-11-15 MED ORDER — OXYBUTYNIN CHLORIDE ER 15 MG PO TB24: 15.0000 mg | ORAL_TABLET | Freq: Every day | ORAL | 11 refills | Status: DC

## 2019-11-16 LAB — URINALYSIS, COMPLETE
Bilirubin, UA: NEGATIVE
Glucose, UA: NEGATIVE
Ketones, UA: NEGATIVE
Leukocytes,UA: NEGATIVE
Nitrite, UA: NEGATIVE
Protein,UA: NEGATIVE
Specific Gravity, UA: 1.02 (ref 1.005–1.030)
Urobilinogen, Ur: 0.2 mg/dL (ref 0.2–1.0)
pH, UA: 5.5 (ref 5.0–7.5)

## 2019-11-16 LAB — MICROSCOPIC EXAMINATION

## 2019-12-27 ENCOUNTER — Ambulatory Visit (INDEPENDENT_AMBULATORY_CARE_PROVIDER_SITE_OTHER): Payer: Managed Care, Other (non HMO) | Admitting: Vascular Surgery

## 2020-01-03 ENCOUNTER — Ambulatory Visit (INDEPENDENT_AMBULATORY_CARE_PROVIDER_SITE_OTHER): Payer: Managed Care, Other (non HMO) | Admitting: Vascular Surgery

## 2020-01-09 ENCOUNTER — Telehealth (INDEPENDENT_AMBULATORY_CARE_PROVIDER_SITE_OTHER): Payer: Self-pay | Admitting: Vascular Surgery

## 2020-01-09 NOTE — Telephone Encounter (Addendum)
Called stating that she is experiencing pain in her right knee down to her leg. She said it started about a week ago. She has been taking tylenol to try and treat the pain. Patient was last seen 09-06-19 with le ven reflux studies with FB. Patient has a f/u appt in Sept 2021, but would like to be seen sooner. Please advise.

## 2020-01-09 NOTE — Telephone Encounter (Signed)
DVT study on LLE...JD or me, patient should also touch base with PCP

## 2020-01-09 NOTE — Telephone Encounter (Signed)
Advised patient to see her PCP and scheduled.

## 2020-01-10 ENCOUNTER — Other Ambulatory Visit (INDEPENDENT_AMBULATORY_CARE_PROVIDER_SITE_OTHER): Payer: Self-pay | Admitting: Nurse Practitioner

## 2020-01-10 DIAGNOSIS — Z86718 Personal history of other venous thrombosis and embolism: Secondary | ICD-10-CM

## 2020-01-10 DIAGNOSIS — M79604 Pain in right leg: Secondary | ICD-10-CM

## 2020-01-11 ENCOUNTER — Encounter (INDEPENDENT_AMBULATORY_CARE_PROVIDER_SITE_OTHER): Payer: Self-pay | Admitting: Nurse Practitioner

## 2020-01-11 ENCOUNTER — Other Ambulatory Visit: Payer: Self-pay

## 2020-01-11 ENCOUNTER — Ambulatory Visit (INDEPENDENT_AMBULATORY_CARE_PROVIDER_SITE_OTHER): Payer: Managed Care, Other (non HMO)

## 2020-01-11 ENCOUNTER — Ambulatory Visit (INDEPENDENT_AMBULATORY_CARE_PROVIDER_SITE_OTHER): Payer: Managed Care, Other (non HMO) | Admitting: Nurse Practitioner

## 2020-01-11 VITALS — BP 132/84 | HR 71 | Resp 16 | Wt 301.4 lb

## 2020-01-11 DIAGNOSIS — I8290 Acute embolism and thrombosis of unspecified vein: Secondary | ICD-10-CM

## 2020-01-11 DIAGNOSIS — I83813 Varicose veins of bilateral lower extremities with pain: Secondary | ICD-10-CM

## 2020-01-11 DIAGNOSIS — M79604 Pain in right leg: Secondary | ICD-10-CM | POA: Diagnosis not present

## 2020-01-11 DIAGNOSIS — M25561 Pain in right knee: Secondary | ICD-10-CM | POA: Diagnosis not present

## 2020-01-11 DIAGNOSIS — I10 Essential (primary) hypertension: Secondary | ICD-10-CM | POA: Diagnosis not present

## 2020-01-11 DIAGNOSIS — Z6841 Body Mass Index (BMI) 40.0 and over, adult: Secondary | ICD-10-CM

## 2020-01-11 DIAGNOSIS — Z86718 Personal history of other venous thrombosis and embolism: Secondary | ICD-10-CM | POA: Diagnosis not present

## 2020-01-11 MED ORDER — APIXABAN 2.5 MG PO TABS: 2.5000 mg | ORAL_TABLET | Freq: Two times a day (BID) | ORAL | 11 refills | Status: DC

## 2020-01-11 NOTE — Progress Notes (Signed)
Subjective:    Patient ID: Lisa Barry, female    DOB: [birth year], 54 y.o.   MRN: [id number] Chief Complaint  Patient presents with  . Follow-up    add on ultrasound per note    She presents today with complaints of pain in her right knee.  The patient notes that she recently went to the beach and noted that she began to have pain in the right knee area that radiated down.  The pain was worse when she was walking up steps.  Since the patient has returned home she is always at pain is much less.  She denies any extensive swelling of the lower extremity.  She denies any fever, chills, nausea, vomiting or diarrhea.  The patient does have a previous history of DVT in addition to factor V Leiden disorder.  She denies missing any of her anticoagulation dosages.  Today noninvasive study showed no evidence of DVT.  The patient does have evidence of a previous right great saphenous vein ablation.  The mid and popliteal femoral veins demonstrate some wall thickening evidence of previous DVT.  No active acute DVT seen.   Review of Systems  Cardiovascular: Positive for leg swelling.  Musculoskeletal: Positive for arthralgias.  All other systems reviewed and are negative.      Objective:   Physical Exam Vitals reviewed.  Constitutional:      Appearance: She is obese.  HENT:     Head: Normocephalic.  Cardiovascular:     Rate and Rhythm: Normal rate.     Pulses: Normal pulses.  Pulmonary:     Effort: Pulmonary effort is normal.  Musculoskeletal:        General: Swelling present.  Skin:    General: Skin is warm and dry.  Neurological:     Mental Status: She is alert and oriented to person, place, and time.  Psychiatric:        Mood and Affect: Mood normal.        Behavior: Behavior normal.        Thought Content: Thought content normal.        Judgment: Judgment normal.     BP 132/84 (BP Location: Right Arm)   Pulse 71   Resp 16   Wt (!) 301 lb 6.4 oz (136.7 kg)   BMI 50.16  kg/m   Past Medical History:  Diagnosis Date  . Blood clot associated with vein wall inflammation 2008  . Factor V Leiden ([place]) 2008  . Hypertension   . TIA (transient ischemic attack) 11/2018    Social History   Socioeconomic History  . Marital status: Married    Spouse name: Not on file  . Number of children: Not on file  . Years of education: Not on file  . Highest education level: Not on file  Occupational History  . Not on file  Tobacco Use  . Smoking status: Never Smoker  . Smokeless tobacco: Never Used  Substance and Sexual Activity  . Alcohol use: Yes    Alcohol/week: 2.0 standard drinks    Types: 2 Glasses of wine per week  . Drug use: No  . Sexual activity: Never  Other Topics Concern  . Not on file  Social History Narrative  . Not on file   Social Determinants of Health   Financial Resource Strain:   . Difficulty of Paying Living Expenses: Not on file  Food Insecurity:   . Worried About Charity fundraiser in the Last Year: Not  on file  . Ran Out of Food in the Last Year: Not on file  Transportation Needs:   . Lack of Transportation (Medical): Not on file  . Lack of Transportation (Non-Medical): Not on file  Physical Activity:   . Days of Exercise per Week: Not on file  . Minutes of Exercise per Session: Not on file  Stress:   . Feeling of Stress : Not on file  Social Connections:   . Frequency of Communication with Friends and Family: Not on file  . Frequency of Social Gatherings with Friends and Family: Not on file  . Attends Religious Services: Not on file  . Active Member of Clubs or Organizations: Not on file  . Attends Archivist Meetings: Not on file  . Marital Status: Not on file  Intimate Partner Violence:   . Fear of Current or Ex-Partner: Not on file  . Emotionally Abused: Not on file  . Physically Abused: Not on file  . Sexually Abused: Not on file    Past Surgical History:  Procedure Laterality Date  . ABDOMINAL  HYSTERECTOMY    . BACK SURGERY    . CHOLECYSTECTOMY    . LUMBAR PERITONEAL SHUNT  2000    Family History  Problem Relation Age of Onset  . Hypertension Mother   . CAD Mother   . COPD Mother   . Bowel Disease Father   . Stroke Father   . Liver disease Sister   . Bladder Cancer Neg Hx   . Kidney cancer Neg Hx   . Prostate cancer Neg Hx     Allergies  Allergen Reactions  . Ace Inhibitors Other (See Comments)  . Corticosteroids Other (See Comments)    Eye condition states may or may not be the cause Eye condition states may or may not be the cause   . Other Other (See Comments)    AVOID STEROIDS DUE TO PREVIOUS EYE CONDITION  BIRTH CONTROL PILLS  . Penicillins Other (See Comments)  . Sulfa Antibiotics Other (See Comments)    Other reaction(s): Unknown Other reaction(s): Unknown  . Tetracycline Other (See Comments)  . Tetracyclines & Related     Other reaction(s): Unknown       Assessment & Plan:   1. Varicose veins of leg with pain, bilateral Recommend:  The patient has had successful ablation of the previously incompetent saphenous venous system of the right lower extremity but still has persistent symptoms of pain and swelling that are having a negative impact on daily life and daily activities.  Patient should undergo injection sclerotherapy to treat the residual varicosities.  The risks, benefits and alternative therapies were reviewed in detail with the patient.  All questions were answered.  The patient agrees to proceed with sclerotherapy at their convenience.  The patient will continue wearing the graduated compression stockings and using the over-the-counter pain medications to treat her symptoms.       2. Essential hypertension Continue antihypertensive medications as already ordered, these medications have been reviewed and there are no changes at this time.   3. Morbid obesity with BMI of 40.0-44.9, adult (HCC) Weight loss can certainly help with  pain varicose veins in addition to helping with knee pain  4. Acute pain of right knee Based upon the patient's description of pain it was likely caused by her recent beach trip.  Noninvasive studies show no evidence of DVT, patient is advised to follow-up with PCP for further work-up.   5. Venous thrombosis  The patient has not had her DVT in well over a year.  At this point it is fairly reasonable to decrease the patient to a maintenance dose of Eliquis given her factor V Leiden disease.  The patient will begin taking a 2.5 mg tablet twice a day for prophylaxis.  We will see the patient on an annual basis.  Current Outpatient Medications on File Prior to Visit  Medication Sig Dispense Refill  . acetaminophen (TYLENOL) 325 MG tablet SMARTSIG:3 Tablet(s) By Mouth Every 6 Hours    . Acyclovir-Hydrocortisone 5-1 % CREA APPLY SMALL AMOUNT TO AFFECTED AREA TWICE A DAY AS NEEDED    . ALPRAZolam (XANAX) 0.5 MG tablet Take 0.5 mg by mouth daily as needed.    Marland Kitchen amLODipine (NORVASC) 10 MG tablet Take 5 mg by mouth.     . citalopram (CELEXA) 20 MG tablet Take by mouth.    . furosemide (LASIX) 20 MG tablet Take by mouth.    . hyoscyamine (LEVBID) 0.375 MG 12 hr tablet     . Iodoquinol-HC-Aloe Polysacch (ALCORTIN A) 1-2-1 % GEL APPLY A THIN LAYER TO AFFECTED AREA(S) (RASH ON SKIN) 2 - 3 TIMES DAILY UP    . loratadine (CLARITIN) 10 MG tablet Take by mouth.    . metoprolol succinate (TOPROL-XL) 25 MG 24 hr tablet TAKE 1 TABLET BY MOUTH EVERY DAY    . omeprazole (PRILOSEC) 40 MG capsule Take by mouth.    . oxybutynin (DITROPAN XL) 15 MG 24 hr tablet Take 1 tablet (15 mg total) by mouth daily. 30 tablet 11  . potassium chloride (K-DUR) 10 MEQ tablet Take by mouth as needed.     . valACYclovir (VALTREX) 500 MG tablet Take by mouth.    . zolpidem (AMBIEN) 5 MG tablet TAKE 1 TABLET BY MOUTH NIGHTLY AS NEEDED FOR SLEEP    . apixaban (ELIQUIS) 5 MG TABS tablet Take 1 tablet (5 mg total) by mouth 2 (two) times  daily. 180 tablet 1  . atorvastatin (LIPITOR) 80 MG tablet Take by mouth.    Marland Kitchen azelastine (ASTELIN) 0.1 % nasal spray Place into the nose.    . levocetirizine (XYZAL) 5 MG tablet Take by mouth as needed.     . valsartan-hydrochlorothiazide (DIOVAN-HCT) 320-12.5 MG tablet Take by mouth.     No current facility-administered medications on file prior to visit.    There are no Patient Instructions on file for this visit. No follow-ups on file.   Kris Hartmann, NP

## 2020-01-24 ENCOUNTER — Ambulatory Visit (INDEPENDENT_AMBULATORY_CARE_PROVIDER_SITE_OTHER): Payer: Managed Care, Other (non HMO) | Admitting: Vascular Surgery

## 2020-02-07 ENCOUNTER — Ambulatory Visit (INDEPENDENT_AMBULATORY_CARE_PROVIDER_SITE_OTHER): Payer: Managed Care, Other (non HMO) | Admitting: Vascular Surgery

## 2020-02-15 ENCOUNTER — Ambulatory Visit (INDEPENDENT_AMBULATORY_CARE_PROVIDER_SITE_OTHER): Payer: Managed Care, Other (non HMO) | Admitting: Vascular Surgery

## 2020-02-15 ENCOUNTER — Encounter (INDEPENDENT_AMBULATORY_CARE_PROVIDER_SITE_OTHER): Payer: Self-pay | Admitting: Vascular Surgery

## 2020-02-15 ENCOUNTER — Other Ambulatory Visit: Payer: Self-pay

## 2020-02-15 VITALS — BP 138/87 | HR 80 | Ht 65.0 in | Wt 299.0 lb

## 2020-02-15 DIAGNOSIS — I83813 Varicose veins of bilateral lower extremities with pain: Secondary | ICD-10-CM

## 2020-02-15 NOTE — Progress Notes (Signed)
Varicose veins of bilateral  lower extremity with inflammation (454.1  I83.10) Current Plans   Indication: Patient presents with symptomatic varicose veins of the bilateral  lower extremity.   Procedure: Sclerotherapy using hypertonic saline mixed with 1% Lidocaine was performed on the bilateral lower extremity. Compression wraps were placed. The patient tolerated the procedure well. 

## 2020-02-17 ENCOUNTER — Ambulatory Visit (INDEPENDENT_AMBULATORY_CARE_PROVIDER_SITE_OTHER): Payer: Managed Care, Other (non HMO) | Admitting: Vascular Surgery

## 2020-02-21 ENCOUNTER — Ambulatory Visit: Payer: Self-pay | Admitting: Urology

## 2020-03-07 ENCOUNTER — Ambulatory Visit: Payer: Self-pay | Admitting: Urology

## 2020-03-09 ENCOUNTER — Other Ambulatory Visit: Payer: Self-pay | Admitting: Physician Assistant

## 2020-03-09 DIAGNOSIS — Z1231 Encounter for screening mammogram for malignant neoplasm of breast: Secondary | ICD-10-CM

## 2020-03-13 ENCOUNTER — Ambulatory Visit: Payer: Managed Care, Other (non HMO) | Admitting: Dermatology

## 2020-03-14 ENCOUNTER — Ambulatory Visit (INDEPENDENT_AMBULATORY_CARE_PROVIDER_SITE_OTHER): Payer: Managed Care, Other (non HMO) | Admitting: Vascular Surgery

## 2020-03-14 ENCOUNTER — Encounter (INDEPENDENT_AMBULATORY_CARE_PROVIDER_SITE_OTHER): Payer: Self-pay | Admitting: Vascular Surgery

## 2020-03-14 ENCOUNTER — Other Ambulatory Visit: Payer: Self-pay

## 2020-03-14 VITALS — BP 132/86 | HR 92 | Resp 16 | Wt 299.0 lb

## 2020-03-14 DIAGNOSIS — I83813 Varicose veins of bilateral lower extremities with pain: Secondary | ICD-10-CM | POA: Diagnosis not present

## 2020-03-14 NOTE — Progress Notes (Signed)
Varicose veins of bilateral  lower extremity with inflammation (454.1  I83.10) Current Plans   Indication: Patient presents with symptomatic varicose veins of the bilateral  lower extremity.   Procedure: Sclerotherapy using hypertonic saline mixed with 1% Lidocaine was performed on the bilateral lower extremity. Compression wraps were placed. The patient tolerated the procedure well. 

## 2020-03-21 ENCOUNTER — Ambulatory Visit: Payer: Managed Care, Other (non HMO) | Admitting: Dermatology

## 2020-04-02 NOTE — Progress Notes (Signed)
04/03/2020 2:46 PM   Lisa Barry [facility] Sep 20, 1965 [id number]  Referring provider: Marinda Elk, MD [place] [facility],  [name] [address]  Chief Complaint  Patient presents with  . Urinary Incontinence    HPI: Lisa Barry is a 54 y.o. female with OAB and urge incontinence who presents today for a three month follow up.  She was seen by Dr. Erlene Barry on 11/15/2019 and started on oxybutynin XL 15 mg daily.    Her PVR is 27 mL. She states that the oxybutynin was effective the first few weeks she was on the medication, but then it became less effective. She is also noticing a worsening in her constipation. She is wondering if there is another medicine that she can try.  Patient denies any modifying or aggravating factors.  Patient denies any gross hematuria, dysuria or suprapubic/flank pain.  Patient denies any fevers, chills, nausea or vomiting.   PMH: Past Medical History:  Diagnosis Date  . Blood clot associated with vein wall inflammation 2008  . Factor V Leiden ([place]) 2008  . Hypertension   . TIA (transient ischemic attack) 11/2018    Surgical History: Past Surgical History:  Procedure Laterality Date  . ABDOMINAL HYSTERECTOMY    . BACK SURGERY    . CHOLECYSTECTOMY    . LUMBAR PERITONEAL SHUNT  2000    Home Medications:  Allergies as of 04/03/2020      Reactions   Ace Inhibitors Other (See Comments)   Corticosteroids Other (See Comments)   Eye condition states may or may not be the cause Eye condition states may or may not be the cause   Other Other (See Comments)   AVOID STEROIDS DUE TO PREVIOUS EYE CONDITION BIRTH CONTROL PILLS   Penicillins Other (See Comments)   Sulfa Antibiotics Other (See Comments)   Other reaction(s): Unknown Other reaction(s): Unknown   Tetracycline Other (See Comments)   Tetracyclines & Related    Other reaction(s): Unknown      Medication List       Accurate as of April 03, 2020  2:46 PM.  If you have any questions, ask your nurse or doctor.        STOP taking these medications   acetaminophen 325 MG tablet Commonly known as: TYLENOL Stopped by: Lisa Ostermiller, PA-C   Acyclovir-Hydrocortisone 5-1 % Crea Stopped by: Lisa Geffre, PA-C   Alcortin A 1-2-1 % Gel Stopped by: Lisa Kerins, PA-C   azelastine 0.1 % nasal spray Commonly known as: ASTELIN Stopped by: Lisa Willers, PA-C   clotrimazole 10 MG troche Commonly known as: MYCELEX Stopped by: Lisa Babiarz, PA-C   cyclobenzaprine 5 MG tablet Commonly known as: FLEXERIL Stopped by: Lisa Rocque, PA-C   fluconazole 150 MG tablet Commonly known as: DIFLUCAN Stopped by: Lisa Fiorillo, PA-C   furosemide 20 MG tablet Commonly known as: LASIX Stopped by: Lisa Tomey, PA-C   levocetirizine 5 MG tablet Commonly known as: XYZAL Stopped by: Lisa Vandenheuvel, PA-C   METRONIDAZOLE (TOPICAL) 0.75 % Lotn Stopped by: Lisa Blahut, PA-C   mupirocin ointment 2 % Commonly known as: BACTROBAN Stopped by: Lisa Eckerman, PA-C   omeprazole 40 MG capsule Commonly known as: PRILOSEC Stopped by: Lisa Goodie, PA-C   ondansetron 4 MG tablet Commonly known as: ZOFRAN Stopped by: Lisa Melendrez, PA-C   oxybutynin 15 MG 24 hr tablet Commonly known as: DITROPAN XL Stopped by: Lisa Council, PA-C     TAKE these medications   ALPRAZolam  0.5 MG tablet Commonly known as: XANAX Take 0.5 mg by mouth daily as needed.   amLODipine 10 MG tablet Commonly known as: NORVASC Take 5 mg by mouth.   apixaban 2.5 MG Tabs tablet Commonly known as: Eliquis Take 1 tablet (2.5 mg total) by mouth 2 (two) times daily.   atorvastatin 80 MG tablet Commonly known as: LIPITOR Take by mouth.   citalopram 20 MG tablet Commonly known as: CELEXA Take by mouth.   hyoscyamine 0.375 MG 12 hr tablet Commonly known as: LEVBID   loratadine 10 MG tablet Commonly known as: CLARITIN Take by mouth.    metoprolol succinate 25 MG 24 hr tablet Commonly known as: TOPROL-XL TAKE 1 TABLET BY MOUTH EVERY DAY   mirabegron ER 25 MG Tb24 tablet Commonly known as: MYRBETRIQ Take 1 tablet (25 mg total) by mouth daily. Started by: Lisa Council, PA-C   nystatin 100000 UNIT/ML suspension Commonly known as: MYCOSTATIN Take by mouth.   potassium chloride 10 MEQ tablet Commonly known as: KLOR-CON Take by mouth as needed.   valACYclovir 500 MG tablet Commonly known as: VALTREX Take by mouth.   valsartan-hydrochlorothiazide 320-12.5 MG tablet Commonly known as: DIOVAN-HCT Take by mouth.   zolpidem 5 MG tablet Commonly known as: AMBIEN TAKE 1 TABLET BY MOUTH NIGHTLY AS NEEDED FOR SLEEP       Allergies:  Allergies  Allergen Reactions  . Ace Inhibitors Other (See Comments)  . Corticosteroids Other (See Comments)    Eye condition states may or may not be the cause Eye condition states may or may not be the cause   . Other Other (See Comments)    AVOID STEROIDS DUE TO PREVIOUS EYE CONDITION  BIRTH CONTROL PILLS  . Penicillins Other (See Comments)  . Sulfa Antibiotics Other (See Comments)    Other reaction(s): Unknown Other reaction(s): Unknown  . Tetracycline Other (See Comments)  . Tetracyclines & Related     Other reaction(s): Unknown    Family History: Family History  Problem Relation Age of Onset  . Hypertension Mother   . CAD Mother   . COPD Mother   . Bowel Disease Father   . Stroke Father   . Liver disease Sister   . Bladder Cancer Neg Hx   . Kidney cancer Neg Hx   . Prostate cancer Neg Hx     Social History:  reports that she has never smoked. She has never used smokeless tobacco. She reports current alcohol use of about 2.0 standard drinks of alcohol per week. She reports that she does not use drugs.  ROS: Pertinent ROS in HPI  Physical Exam: BP (!) 154/92   Pulse 96   Ht 5\' 5"  (1.651 m)   Wt 299 lb (135.6 kg)   BMI 49.76 kg/m   Constitutional:   Well nourished. Alert and oriented, No acute distress. HEENT: [place] AT, mask in place.  Trachea midline Cardiovascular: No clubbing, cyanosis, or edema. Respiratory: Normal respiratory effort, no increased work of breathing. Neurologic: Grossly intact, no focal deficits, moving all 4 extremities. Psychiatric: Normal mood and affect.   Laboratory Data: Lab Results  Component Value Date   WBC 7.2 11/21/2018   HGB 14.4 11/21/2018   HCT 43.9 11/21/2018   MCV 84.4 11/21/2018   PLT 222 11/21/2018    Lab Results  Component Value Date   CREATININE 0.60 11/21/2018    Lab Results  Component Value Date   AST 25 11/21/2018   Lab Results  Component Value Date  ALT 24 11/21/2018    Urinalysis    Component Value Date/Time   COLORURINE YELLOW (A) 11/21/2018 1357   APPEARANCEUR Hazy (A) 11/15/2019 0858   LABSPEC 1.020 11/21/2018 1357   PHURINE 8.0 11/21/2018 1357   GLUCOSEU Negative 11/15/2019 0858   HGBUR NEGATIVE 11/21/2018 1357   BILIRUBINUR Negative 11/15/2019 0858   KETONESUR 20 (A) 11/21/2018 1357   PROTEINUR Negative 11/15/2019 0858   PROTEINUR NEGATIVE 11/21/2018 1357   NITRITE Negative 11/15/2019 0858   NITRITE NEGATIVE 11/21/2018 1357   LEUKOCYTESUR Negative 11/15/2019 0858   LEUKOCYTESUR NEGATIVE 11/21/2018 1357  I have reviewed the labs.   Pertinent Imaging: Results for JERIE, BASFORD (MRN [id number]) as of 04/03/2020 14:44  Ref. Range 04/03/2020 14:29  Scan Result Unknown 27       Assessment & Plan:    1. OAB/Urge incontinence Not at goal with oxybutynin We will have a trial of Myrbetriq 25 mg daily, #28 samples given We will have a telehealth visit in 3 weeks to discuss its effectiveness  Return in about 3 weeks (around 04/24/2020) for Telehealth Visit .  These notes generated with voice recognition software. I apologize for typographical errors.  Lisa Council, PA-C  [facility] [address]  [place],  [place] [address] [phone]

## 2020-04-03 ENCOUNTER — Encounter: Payer: Self-pay | Admitting: Urology

## 2020-04-03 ENCOUNTER — Ambulatory Visit (INDEPENDENT_AMBULATORY_CARE_PROVIDER_SITE_OTHER): Payer: Managed Care, Other (non HMO) | Admitting: Urology

## 2020-04-03 ENCOUNTER — Other Ambulatory Visit: Payer: Self-pay

## 2020-04-03 VITALS — BP 154/92 | HR 96 | Ht 65.0 in | Wt 299.0 lb

## 2020-04-03 DIAGNOSIS — R32 Unspecified urinary incontinence: Secondary | ICD-10-CM

## 2020-04-03 LAB — BLADDER SCAN AMB NON-IMAGING: Scan Result: 27

## 2020-04-03 MED ORDER — MIRABEGRON ER 25 MG PO TB24: 25.0000 mg | ORAL_TABLET | Freq: Every day | ORAL | 0 refills | Status: DC

## 2020-04-17 ENCOUNTER — Other Ambulatory Visit: Payer: Self-pay

## 2020-04-17 ENCOUNTER — Ambulatory Visit
Admission: RE | Admit: 2020-04-17 | Discharge: 2020-04-17 | Disposition: A | Discharge status: Home / Self Care | Payer: Managed Care, Other (non HMO) | Source: Ambulatory Visit | Attending: Physician Assistant | Admitting: Physician Assistant

## 2020-04-17 DIAGNOSIS — Z1231 Encounter for screening mammogram for malignant neoplasm of breast: Secondary | ICD-10-CM | POA: Insufficient documentation

## 2020-04-18 ENCOUNTER — Ambulatory Visit (INDEPENDENT_AMBULATORY_CARE_PROVIDER_SITE_OTHER): Payer: Managed Care, Other (non HMO) | Admitting: Vascular Surgery

## 2020-04-24 ENCOUNTER — Ambulatory Visit: Payer: Managed Care, Other (non HMO) | Admitting: Dermatology

## 2020-04-24 ENCOUNTER — Other Ambulatory Visit: Payer: Self-pay

## 2020-04-24 DIAGNOSIS — L821 Other seborrheic keratosis: Secondary | ICD-10-CM

## 2020-04-24 DIAGNOSIS — L219 Seborrheic dermatitis, unspecified: Secondary | ICD-10-CM | POA: Diagnosis not present

## 2020-04-24 DIAGNOSIS — R21 Rash and other nonspecific skin eruption: Secondary | ICD-10-CM | POA: Diagnosis not present

## 2020-04-24 DIAGNOSIS — L820 Inflamed seborrheic keratosis: Secondary | ICD-10-CM

## 2020-04-24 MED ORDER — ACYCLOVIR 5 % EX OINT: TOPICAL_OINTMENT | CUTANEOUS | 6 refills | Status: AC

## 2020-04-24 MED ORDER — VALACYCLOVIR HCL 500 MG PO TABS: 500.0000 mg | ORAL_TABLET | Freq: Two times a day (BID) | ORAL | 3 refills | Status: AC

## 2020-04-24 MED ORDER — FLUOCINOLONE ACETONIDE 0.01 % EX SOLN: CUTANEOUS | 3 refills | Status: DC

## 2020-04-24 NOTE — Patient Instructions (Signed)

## 2020-04-24 NOTE — Progress Notes (Signed)
Virtual Visit via Telephone Note  I connected with Lisa Barry on 04/25/20 at  3:30 PM EST by telephone and verified that I am speaking with the correct person using two identifiers.  Location: Patient: Home Provider: Office   I discussed the limitations, risks, security and privacy concerns of performing an evaluation and management service by telephone and the availability of in person appointments. I also discussed with the patient that there may be a patient responsible charge related to this service. The patient expressed understanding and agreed to proceed.   History of Present Illness: Lisa Barry is a 54 y.o. female with OAB and urge incontinence who was not at goal with oxybutynin XL 15 mg daily, so we had a trial of Myrbetriq 25 mg daily.  The oxybutynin worsened her constipation and the effects were not long lasting.  She is at goal with the Myrbetriq 25 mg daily.  She is not experiencing any headaches.  She is also not having any worsening of her constipation with the Myrbetriq.  Patient denies any modifying or aggravating factors.  Patient denies any gross hematuria, dysuria or suprapubic/flank pain.  Patient denies any fevers, chills, nausea or vomiting.    Observations/Objective: Patient does not sound distressed and is answering questions appropriately.    Assessment and Plan: OAB Urge incontinence  Follow Up Instructions:  She will follow-up in 1 year with OAB questionnaire and PVR.  I also advised her that if the Myrbetriq becomes cost prohibitive, to contact her office so that we may assist her in coverage of the medication.    I discussed the assessment and treatment plan with the patient. The patient was provided an opportunity to ask questions and all were answered. The patient agreed with the plan and demonstrated an understanding of the instructions.   The patient was advised to call back or seek an in-person evaluation if the symptoms worsen or if the  condition fails to improve as anticipated.  I provided 10 minutes of non-face-to-face time during this encounter.   Leopold Smyers, PA-C

## 2020-04-24 NOTE — Progress Notes (Signed)
   Follow-Up Visit   Subjective  Lisa Barry is a 54 y.o. female who presents for the following: scalp issues (Patient c/o itching even though she is using H&S shampoo. Patient hasn't noticed any flaking and scale) and Rash (On the superior buttocks crease - itching, burning, irritation. Patient is currently using Iodoquinol/HC/niacinamide cream BID). The patient has also noticed other lesions on her R sideburn area, chest, inframammary, and back that she is concerned about and would like checked.  They get irritated.   The following portions of the chart were reviewed this encounter and updated as appropriate:      Review of Systems:  No other skin or systemic complaints except as noted in HPI or Assessment and Plan.  Objective  Well appearing patient in no apparent distress; mood and affect are within normal limits.  A focused examination was performed including scalp, face, trunk. Relevant physical exam findings are noted in the Assessment and Plan.  Objective  Frontal/temporal hairline: Mild scale, itches  Objective  R preauricular x 1, lower sternum x 1, R upper abdomen x 1, spinal mid back at braline x 1 (4): Erythematous keratotic or waxy stuck-on papule  0.4 cm waxy dark brown papule on the spinal mid back at bra-line.  Objective  Gluteal Crease: Clustered punctate erosions on an erythematous base of the left upper gluteal cleft.   Assessment & Plan  Seborrheic dermatitis Frontal/temporal hairline  Start Fluocinolone solution - apply to aa's BID. Continue H&S shampooo daily. Let sit about 5 minutes before washing out.  Seborrheic Dermatitis  -  is a chronic persistent rash characterized by pinkness and scaling most commonly of the mid face but also can occur on the scalp (dandruff); mid chest and mid back. It tends to be exacerbated by stress and cooler weather.  People who have neurologic disease may experience new onset or exacerbation of existing seborrheic  dermatitis.  The condition is not curable but treatable and can be controlled.   fluocinolone (SYNALAR) 0.01 % external solution - Frontal/temporal hairline  Inflamed seborrheic keratosis (4) R preauricular x 1, lower sternum x 1, R upper abdomen x 1, spinal mid back at braline x 1  Destruction of lesion - R preauricular x 1, lower sternum x 1, R upper abdomen x 1, spinal mid back at braline x 1  Destruction method: cryotherapy   Informed consent: discussed and consent obtained   Timeout:  patient name, date of birth, surgical site, and procedure verified Lesion destroyed using liquid nitrogen: Yes   Region frozen until ice ball extended beyond lesion: Yes   Outcome: patient tolerated procedure well with no complications   Post-procedure details: wound care instructions given    Rash Gluteal Crease  Consistent with HSV - Chronic recurrent condition  Start Valtrex 500mg  po QD to prevent recurrence. Should outbreak occur increase to BID x 7 days. #180 3RF. Start Zovirax ointment to aa's QID. OK to continue Iodoquinol/HC/niacinamide cream BID prn itch/burn.   valACYclovir (VALTREX) 500 MG tablet - Gluteal Crease  acyclovir ointment (ZOVIRAX) 5 % - Gluteal Crease  Seborrheic Keratoses - Stuck-on, waxy, tan-brown papules and plaques  - Discussed benign etiology and prognosis. - Observe - Call for any changes   Return for follow up in 2 months.  Luther Redo, CMA, am acting as scribe for Brendolyn Patty, MD .  Documentation: I have reviewed the above documentation for accuracy and completeness, and I agree with the above.  Brendolyn Patty MD

## 2020-04-25 ENCOUNTER — Encounter: Payer: Self-pay | Admitting: Urology

## 2020-04-25 ENCOUNTER — Telehealth (INDEPENDENT_AMBULATORY_CARE_PROVIDER_SITE_OTHER): Payer: Managed Care, Other (non HMO) | Admitting: Urology

## 2020-04-25 DIAGNOSIS — R32 Unspecified urinary incontinence: Secondary | ICD-10-CM | POA: Diagnosis not present

## 2020-04-25 MED ORDER — MIRABEGRON ER 25 MG PO TB24: 25.0000 mg | ORAL_TABLET | Freq: Every day | ORAL | 3 refills | Status: DC

## 2020-05-16 ENCOUNTER — Ambulatory Visit: Payer: Managed Care, Other (non HMO) | Admitting: Dermatology

## 2020-05-16 ENCOUNTER — Ambulatory Visit (INDEPENDENT_AMBULATORY_CARE_PROVIDER_SITE_OTHER): Payer: Managed Care, Other (non HMO) | Admitting: Vascular Surgery

## 2020-05-29 ENCOUNTER — Ambulatory Visit: Payer: Managed Care, Other (non HMO) | Admitting: Dermatology

## 2020-06-13 ENCOUNTER — Ambulatory Visit (INDEPENDENT_AMBULATORY_CARE_PROVIDER_SITE_OTHER): Payer: Managed Care, Other (non HMO) | Admitting: Vascular Surgery

## 2020-06-26 ENCOUNTER — Ambulatory Visit: Payer: Managed Care, Other (non HMO) | Admitting: Dermatology

## 2020-06-27 ENCOUNTER — Ambulatory Visit (INDEPENDENT_AMBULATORY_CARE_PROVIDER_SITE_OTHER): Payer: Managed Care, Other (non HMO) | Admitting: Vascular Surgery

## 2020-07-11 ENCOUNTER — Ambulatory Visit (INDEPENDENT_AMBULATORY_CARE_PROVIDER_SITE_OTHER): Payer: Managed Care, Other (non HMO) | Admitting: Vascular Surgery

## 2020-08-22 ENCOUNTER — Ambulatory Visit (INDEPENDENT_AMBULATORY_CARE_PROVIDER_SITE_OTHER): Payer: Managed Care, Other (non HMO) | Admitting: Vascular Surgery

## 2020-09-19 ENCOUNTER — Ambulatory Visit (INDEPENDENT_AMBULATORY_CARE_PROVIDER_SITE_OTHER): Payer: Managed Care, Other (non HMO) | Admitting: Vascular Surgery

## 2020-10-29 ENCOUNTER — Other Ambulatory Visit (INDEPENDENT_AMBULATORY_CARE_PROVIDER_SITE_OTHER): Payer: Self-pay | Admitting: Nurse Practitioner

## 2020-12-14 ENCOUNTER — Ambulatory Visit (INDEPENDENT_AMBULATORY_CARE_PROVIDER_SITE_OTHER): Payer: Managed Care, Other (non HMO) | Admitting: Urology

## 2020-12-14 ENCOUNTER — Other Ambulatory Visit: Payer: Self-pay

## 2020-12-14 DIAGNOSIS — R32 Unspecified urinary incontinence: Secondary | ICD-10-CM

## 2020-12-14 NOTE — Progress Notes (Signed)
Virtual Visit via Telephone Note  I connected with Lisa Barry on 12/14/20 at 11:30 AM EDT by telephone and verified that I am speaking with the correct person using two identifiers.  Location: Patient: Home  Provider: Office    I discussed the limitations, risks, security and privacy concerns of performing an evaluation and management service by telephone and the availability of in person appointments. I also discussed with the patient that there may be a patient responsible charge related to this service. The patient expressed understanding and agreed to proceed.   History of Present Illness: Urological history: 1. OAB/Urge incontinence  -contributing factors of age, obesity, hydrocephalus and stroke -managed with Myrbetriq 25 mg daily   She has been doing well with her Myrbetriq 25 mg daily until a few weeks ago when she noticed a small amount of leakage in her underwear with a smell of urine prior to showering.  She states she does not find the volume of leakage enough to warrant wearing incontinent pads.  She feels her daytime and nighttime voidings are not excessive.  Patient denies any modifying or aggravating factors.  Patient denies any gross hematuria, dysuria or suprapubic/flank pain.  Patient denies any fevers, chills, nausea or vomiting.     Observations/Objective: Patient answers questions appropriately and does not sound distressed.    Assessment and Plan: 1. OAB/Urge incontinence -We will have a trial of Myrbetriq 50 mg daily.  She states she feels she has enough of the Myrbetriq 25 mg on hand to take 2 tablets a day for 2 to 3 weeks.  She will contact the office for samples if she runs out of the 25 mg.  She will also contact me after 3 weeks regarding whether or not the increase of the Myrbetriq to 50 mg solved the incontinence issue.  Follow Up Instructions:  Keep follow-up in December.   I discussed the assessment and treatment plan with the patient. The patient  was provided an opportunity to ask questions and all were answered. The patient agreed with the plan and demonstrated an understanding of the instructions.   The patient was advised to call back or seek an in-person evaluation if the symptoms worsen or if the condition fails to improve as anticipated.  I provided 16 minutes of non-face-to-face time during this encounter.   Anikin Prosser, PA-C

## 2020-12-14 NOTE — Progress Notes (Signed)
This service is provided via telemedicine   No vital signs collected/recorded due to the encounter was a telemedicine visit.     Patient consents to a telephone visit:  Yes Medications, Pharmacy, allergies reviewed   Names of all persons participating in the telemedicine service and their role in the encounter:  Lisa Barry, [place]

## 2021-01-23 ENCOUNTER — Ambulatory Visit: Payer: Managed Care, Other (non HMO) | Admitting: Dermatology

## 2021-02-13 ENCOUNTER — Other Ambulatory Visit: Payer: Self-pay

## 2021-02-13 ENCOUNTER — Ambulatory Visit: Payer: Managed Care, Other (non HMO) | Admitting: Dermatology

## 2021-02-13 ENCOUNTER — Encounter: Payer: Self-pay | Admitting: Dermatology

## 2021-02-13 DIAGNOSIS — L821 Other seborrheic keratosis: Secondary | ICD-10-CM | POA: Diagnosis not present

## 2021-02-13 DIAGNOSIS — L219 Seborrheic dermatitis, unspecified: Secondary | ICD-10-CM | POA: Diagnosis not present

## 2021-02-13 DIAGNOSIS — L820 Inflamed seborrheic keratosis: Secondary | ICD-10-CM

## 2021-02-13 MED ORDER — MOMETASONE FUROATE 0.1 % EX SOLN: CUTANEOUS | 2 refills | Status: DC

## 2021-02-13 MED ORDER — CICLOPIROX 1 % EX SHAM: MEDICATED_SHAMPOO | CUTANEOUS | 3 refills | Status: DC

## 2021-02-13 NOTE — Patient Instructions (Signed)
Prior to procedure, discussed risks of blister formation, small wound, skin dyspigmentation, or rare scar following cryotherapy. Recommend Vaseline ointment to treated areas while healing.   Cryotherapy Aftercare  Wash gently with soap and water everyday.   Apply Vaseline and Band-Aid daily until healed.   Topical steroids (such as triamcinolone, fluocinolone, fluocinonide, mometasone, clobetasol, halobetasol, betamethasone, hydrocortisone) can cause thinning and lightening of the skin if they are used for too long in the same area. Your physician has selected the right strength medicine for your problem and area affected on the body. Please use your medication only as directed by your physician to prevent side effects.    If you have any questions or concerns for your doctor, please call our main line at [phone] and press option 4 to reach your doctor's medical assistant. If no one answers, please leave a voicemail as directed and we will return your call as soon as possible. Messages left after 4 pm will be answered the following business day.   You may also send [place] a message via [place]. We typically respond to MyChart messages within 1-2 business days.  For prescription refills, please ask your pharmacy to contact our office. Our fax number is [phone].  If you have an urgent issue when the clinic is closed that cannot wait until the next business day, you can page your doctor at the number below.    Please note that while we do our best to be available for urgent issues outside of office hours, we are not available 24/7.   If you have an urgent issue and are unable to reach [place], you may choose to seek medical care at your doctor's office, retail clinic, urgent care center, or emergency room.  If you have a medical emergency, please immediately call 911 or go to the emergency department.  Pager Numbers  - Dr. Nehemiah Massed: [phone]  - Dr. Laurence Ferrari: [phone]  - Dr. Nicole Kindred:  [phone]  In the event of inclement weather, please call our main line at [phone] for an update on the status of any delays or closures.  Dermatology Medication Tips: Please keep the boxes that topical medications come in in order to help keep track of the instructions about where and how to use these. Pharmacies typically print the medication instructions only on the boxes and not directly on the medication tubes.   If your medication is too expensive, please contact our office at [phone] option 4 or send [place] a message through [place].   We are unable to tell what your co-pay for medications will be in advance as this is different depending on your insurance coverage. However, we may be able to find a substitute medication at lower cost or fill out paperwork to get insurance to cover a needed medication.   If a prior authorization is required to get your medication covered by your insurance company, please allow [place] 1-2 business days to complete this process.  Drug prices often vary depending on where the prescription is filled and some pharmacies may offer cheaper prices.  The website www.goodrx.com contains coupons for medications through different pharmacies. The prices here do not account for what the cost may be with help from insurance (it may be cheaper with your insurance), but the website can give you the price if you did not use any insurance.  - You can print the associated coupon and take it with your prescription to the pharmacy.  - You may also stop by our office during  regular business hours and pick up a GoodRx coupon card.  - If you need your prescription sent electronically to a different pharmacy, notify our office through [facility] or by phone at [phone] option 4.

## 2021-02-13 NOTE — Progress Notes (Signed)
   Follow-Up Visit   Subjective  Lisa Barry is a 55 y.o. female who presents for the following: Pruritis (Hair line. Comes and goes. Uses fluocinolone solution as needed (twice a week). Uses [employer] Clinical strength shampoo. Check for psoriasis. ) and lesion (Right cheek. C/O bump. Larger. Non tender. Spots on left cheek and left upper lip below nose. ). Spot on R cheek and under nose itch and get irritated.   The following portions of the chart were reviewed this encounter and updated as appropriate:      Review of Systems: No other skin or systemic complaints except as noted in HPI or Assessment and Plan.   Objective  Well appearing patient in no apparent distress; mood and affect are within normal limits.  A focused examination was performed including head, including the scalp, face, neck, nose, ears, eyelids, and lips. Relevant physical exam findings are noted in the Assessment and Plan.  Scalp, frontal hair line Mild erythema with scale at scalp  left jaw Stuck-on, waxy, tan-brown papules -- Discussed benign etiology and prognosis.   Right sideburn area x1, right upper lip below nostril x1 (2) Erythematous keratotic or waxy stuck-on papule or plaque.   Assessment & Plan  Seborrheic dermatitis Scalp, frontal hair line  Start Mometasone lotion QD-BID scalp PRN Start Ciclopirox shampoo 2-3x/week as directed.  D/C fluocinolone solution (too oily per pt)  Seborrheic Dermatitis  -  is a chronic persistent rash characterized by pinkness and scaling most commonly of the mid face but also can occur on the scalp (dandruff), ears; mid chest, mid back and groin.  It tends to be exacerbated by stress and cooler weather.  People who have neurologic disease may experience new onset or exacerbation of existing seborrheic dermatitis.  The condition is not curable but treatable and can be controlled.   Topical steroids (such as triamcinolone, fluocinolone, fluocinonide, mometasone,  clobetasol, halobetasol, betamethasone, hydrocortisone) can cause thinning and lightening of the skin if they are used for too long in the same area. Your physician has selected the right strength medicine for your problem and area affected on the body. Please use your medication only as directed by your physician to prevent side effects.    Ciclopirox 1 % shampoo - Scalp, frontal hair line 2-3 times a week lather on scalp, leave on 5-8 minutes, rinse well.  mometasone (ELOCON) 0.1 % lotion - Scalp, frontal hair line Apply qd-bid to affected areas on scalp  Related Medications fluocinolone (SYNALAR) 0.01 % external solution Apply to aa's scalp BID  Seborrheic keratosis left jaw  Reassured benign age-related growth.  Recommend observation.  Discussed cryotherapy if spot(s) become irritated or inflamed.  Inflamed seborrheic keratosis Right sideburn area x1, right upper lip below nostril x1  Destruction of lesion - Right sideburn area x1, right upper lip below nostril x1  Destruction method: cryotherapy   Informed consent: discussed and consent obtained   Lesion destroyed using liquid nitrogen: Yes   Region frozen until ice ball extended beyond lesion: Yes   Outcome: patient tolerated procedure well with no complications   Post-procedure details: wound care instructions given    Return if symptoms worsen or fail to improve.  I, Emelia Salisbury, CMA, am acting as scribe for Brendolyn Patty, MD.  Documentation: I have reviewed the above documentation for accuracy and completeness, and I agree with the above.  Brendolyn Patty MD

## 2021-02-18 ENCOUNTER — Other Ambulatory Visit: Payer: Self-pay

## 2021-02-18 ENCOUNTER — Telehealth: Payer: Self-pay

## 2021-02-18 DIAGNOSIS — L219 Seborrheic dermatitis, unspecified: Secondary | ICD-10-CM

## 2021-02-18 MED ORDER — KETOCONAZOLE 2 % EX SHAM: MEDICATED_SHAMPOO | CUTANEOUS | 3 refills | Status: DC

## 2021-02-18 NOTE — Telephone Encounter (Signed)
Patient has checked all the local [facility] and even went to [facility] but they are out of the Ciclopirox and its unknown when it will be back in stock. I called [facility] and they are out as well. Any alternative we can send in for patient?

## 2021-02-19 NOTE — Telephone Encounter (Signed)
Replacement shampoo sent in and patient advised.

## 2021-03-05 ENCOUNTER — Other Ambulatory Visit: Payer: Self-pay | Admitting: Urology

## 2021-04-26 ENCOUNTER — Ambulatory Visit: Payer: Managed Care, Other (non HMO) | Admitting: Urology

## 2021-05-15 ENCOUNTER — Other Ambulatory Visit: Payer: Self-pay | Admitting: Physician Assistant

## 2021-05-15 DIAGNOSIS — Z1231 Encounter for screening mammogram for malignant neoplasm of breast: Secondary | ICD-10-CM

## 2021-06-12 ENCOUNTER — Other Ambulatory Visit: Payer: Self-pay

## 2021-06-12 ENCOUNTER — Ambulatory Visit: Payer: Managed Care, Other (non HMO) | Admitting: Dermatology

## 2021-06-12 DIAGNOSIS — D1800 Hemangioma unspecified site: Secondary | ICD-10-CM

## 2021-06-12 DIAGNOSIS — D2261 Melanocytic nevi of right upper limb, including shoulder: Secondary | ICD-10-CM | POA: Diagnosis not present

## 2021-06-12 DIAGNOSIS — D229 Melanocytic nevi, unspecified: Secondary | ICD-10-CM

## 2021-06-12 DIAGNOSIS — Z1283 Encounter for screening for malignant neoplasm of skin: Secondary | ICD-10-CM | POA: Diagnosis not present

## 2021-06-12 DIAGNOSIS — L820 Inflamed seborrheic keratosis: Secondary | ICD-10-CM

## 2021-06-12 DIAGNOSIS — L578 Other skin changes due to chronic exposure to nonionizing radiation: Secondary | ICD-10-CM

## 2021-06-12 DIAGNOSIS — D492 Neoplasm of unspecified behavior of bone, soft tissue, and skin: Secondary | ICD-10-CM

## 2021-06-12 DIAGNOSIS — D2362 Other benign neoplasm of skin of left upper limb, including shoulder: Secondary | ICD-10-CM | POA: Diagnosis not present

## 2021-06-12 DIAGNOSIS — L814 Other melanin hyperpigmentation: Secondary | ICD-10-CM

## 2021-06-12 DIAGNOSIS — L821 Other seborrheic keratosis: Secondary | ICD-10-CM

## 2021-06-12 DIAGNOSIS — D239 Other benign neoplasm of skin, unspecified: Secondary | ICD-10-CM

## 2021-06-12 NOTE — Progress Notes (Signed)
Follow-Up Visit   Subjective  Lisa Barry is a 56 y.o. female who presents for the following: Skin Problem (Check a mole her left back ). The patient presents for Upper Body Skin Exam (UBSE) for skin cancer screening and mole check.  The patient has spots, moles and lesions to be evaluated, some may be new or changing and the patient has concerns that these could be cancer.   The following portions of the chart were reviewed this encounter and updated as appropriate:   Tobacco   Allergies   Meds   Problems   Med Hx   Surg Hx   Fam Hx      Review of Systems:  No other skin or systemic complaints except as noted in HPI or Assessment and Plan.  Objective  Well appearing patient in no apparent distress; mood and affect are within normal limits.  All skin waist up examined.  Left Lower Back  x 1 Stuck-on, waxy, tan-brown papule   left lateral tricep Firm pink/brown papulenodule with dimple sign.   right distal lateral tricep 0.6 cm irregular brown macule         Assessment & Plan  Inflamed seborrheic keratosis Left Lower Back  x 1  Reassured benign age-related growth.  Recommend observation.  Discussed cryotherapy if spot(s) become irritated or inflamed.   Destruction of lesion - Left Lower Back  x 1 Complexity: simple   Destruction method: cryotherapy   Informed consent: discussed and consent obtained   Timeout:  patient name, date of birth, surgical site, and procedure verified Lesion destroyed using liquid nitrogen: Yes   Region frozen until ice ball extended beyond lesion: Yes   Outcome: patient tolerated procedure well with no complications   Post-procedure details: wound care instructions given    Dermatofibroma left lateral tricep  Benign-appearing.  Observation.  Call clinic for new or changing moles.  Recommend daily use of broad spectrum spf 30+ sunscreen to sun-exposed areas.    Neoplasm of skin right distal lateral tricep  Epidermal / dermal  shaving  Lesion diameter (cm):  0.6 Informed consent: discussed and consent obtained   Timeout: patient name, date of birth, surgical site, and procedure verified   Patient was prepped and draped in usual sterile fashion: area prepped with alcohol. Anesthesia: the lesion was anesthetized in a standard fashion   Anesthetic:  1% lidocaine w/ epinephrine 1-100,000 local infiltration Instrument used: flexible razor blade   Hemostasis achieved with: pressure, aluminum chloride and electrodesiccation   Outcome: patient tolerated procedure well   Post-procedure details: wound care instructions given   Post-procedure details comment:  Ointment and a small bandage applied  Related Procedures Anatomic Pathology Report   Lentigines - Scattered tan macules - Due to sun exposure - Benign-appearing, observe - Recommend daily broad spectrum sunscreen SPF 30+ to sun-exposed areas, reapply every 2 hours as needed. - Call for any changes  Seborrheic Keratoses - Stuck-on, waxy, tan-brown papules and/or plaques  - Benign-appearing - Discussed benign etiology and prognosis. - Observe - Call for any changes  Melanocytic Nevi - Tan-brown and/or pink-flesh-colored symmetric macules and papules - Benign appearing on exam today - Observation - Call clinic for new or changing moles - Recommend daily use of broad spectrum spf 30+ sunscreen to sun-exposed areas.   Hemangiomas - Red papules - Discussed benign nature - Observe - Call for any changes  Actinic Damage - Chronic condition, secondary to cumulative UV/sun exposure - diffuse scaly erythematous macules with underlying  dyspigmentation - Recommend daily broad spectrum sunscreen SPF 30+ to sun-exposed areas, reapply every 2 hours as needed.  - Staying in the shade or wearing long sleeves, sun glasses (UVA+UVB protection) and wide brim hats (4-inch brim around the entire circumference of the hat) are also recommended for sun protection.  -  Call for new or changing lesions.  Skin cancer screening performed today.   Return in about 1 year (around 06/12/2022) for tbse.  IMarye Round, CMA, am acting as scribe for Sarina Ser, MD .  Documentation: I have reviewed the above documentation for accuracy and completeness, and I agree with the above.  Sarina Ser, MD

## 2021-06-12 NOTE — Patient Instructions (Addendum)
Wound Care Instructions  Cleanse wound gently with soap and water once a day then pat dry with clean gauze. Apply a thing coat of Petrolatum (petroleum jelly, "Vaseline") over the wound (unless you have an allergy to this). We recommend that you use a new, sterile tube of Vaseline. Do not pick or remove scabs. Do not remove the yellow or white "healing tissue" from the base of the wound.  Cover the wound with fresh, clean, nonstick gauze and secure with paper tape. You may use Band-Aids in place of gauze and tape if the would is small enough, but would recommend trimming much of the tape off as there is often too much. Sometimes Band-Aids can irritate the skin.  You should call the office for your biopsy report after 1 week if you have not already been contacted.  If you experience any problems, such as abnormal amounts of bleeding, swelling, significant bruising, significant pain, or evidence of infection, please call the office immediately.  FOR ADULT SURGERY PATIENTS: If you need something for pain relief you may take 1 extra strength Tylenol (acetaminophen) AND 2 Ibuprofen (200mg  each) together every 4 hours as needed for pain. (do not take these if you are allergic to them or if you have a reason you should not take them.) Typically, you may only need pain medication for 1 to 3 days.       Cryotherapy Aftercare  Wash gently with soap and water everyday.   Apply Vaseline and Band-Aid daily until healed.    If You Need Anything After Your Visit  If you have any questions or concerns for your doctor, please call our main line at [phone] and press option 4 to reach your doctor's medical assistant. If no one answers, please leave a voicemail as directed and we will return your call as soon as possible. Messages left after 4 pm will be answered the following business day.   You may also send [place] a message via [place]. We typically respond to MyChart messages within 1-2 business  days.  For prescription refills, please ask your pharmacy to contact our office. Our fax number is [phone].  If you have an urgent issue when the clinic is closed that cannot wait until the next business day, you can page your doctor at the number below.    Please note that while we do our best to be available for urgent issues outside of office hours, we are not available 24/7.   If you have an urgent issue and are unable to reach [place], you may choose to seek medical care at your doctor's office, retail clinic, urgent care center, or emergency room.  If you have a medical emergency, please immediately call 911 or go to the emergency department.  Pager Numbers  - Dr. Nehemiah Massed: [phone]  - Dr. Laurence Ferrari: [phone]  - Dr. Nicole Kindred: [phone]  In the event of inclement weather, please call our main line at [phone] for an update on the status of any delays or closures.  Dermatology Medication Tips: Please keep the boxes that topical medications come in in order to help keep track of the instructions about where and how to use these. Pharmacies typically print the medication instructions only on the boxes and not directly on the medication tubes.   If your medication is too expensive, please contact our office at [phone] option 4 or send [place] a message through [place].   We are unable to tell what your co-pay for medications will be in  advance as this is different depending on your insurance coverage. However, we may be able to find a substitute medication at lower cost or fill out paperwork to get insurance to cover a needed medication.   If a prior authorization is required to get your medication covered by your insurance company, please allow [place] 1-2 business days to complete this process.  Drug prices often vary depending on where the prescription is filled and some pharmacies may offer cheaper prices.  The website www.goodrx.com contains coupons for medications through  different pharmacies. The prices here do not account for what the cost may be with help from insurance (it may be cheaper with your insurance), but the website can give you the price if you did not use any insurance.  - You can print the associated coupon and take it with your prescription to the pharmacy.  - You may also stop by our office during regular business hours and pick up a GoodRx coupon card.  - If you need your prescription sent electronically to a different pharmacy, notify our office through [facility] or by phone at [phone] option 4.     Si Usted Necesita Algo Despus de Su Visita  Tambin puede enviarnos un mensaje a travs de Pharmacist, community. Por lo general respondemos a los mensajes de MyChart en el transcurso de 1 a 2 das hbiles.  Para renovar recetas, por favor pida a su farmacia que se ponga en contacto con nuestra oficina. Harland Dingwall de fax es [place] [phone].  Si tiene un asunto urgente cuando la clnica est cerrada y que no puede esperar hasta el siguiente da hbil, puede llamar/localizar a su doctor(a) al nmero que aparece a continuacin.   Por favor, tenga en cuenta que aunque hacemos todo lo posible para estar disponibles para asuntos urgentes fuera del horario de [place], no estamos disponibles las 24 horas del da, los 7 das de la [place].   Si tiene un problema urgente y no puede comunicarse con nosotros, puede optar por buscar atencin mdica  en el consultorio de su doctor(a), en una clnica privada, en un centro de atencin urgente o en una sala de emergencias.  Si tiene Engineering geologist, por favor llame inmediatamente al 911 o vaya a la sala de emergencias.  Nmeros de bper  - Dr. Nehemiah Massed: [phone]  - Dra. Moye: [phone]  - Dra. Nicole Kindred: [phone]  En caso de inclemencias del [place], por favor llame a Johnsie Kindred principal al [phone] para una actualizacin sobre el [place] de cualquier retraso o cierre.  Consejos  para la medicacin en dermatologa: Por favor, guarde las cajas en las que vienen los medicamentos de uso tpico para ayudarle a seguir las instrucciones sobre dnde y cmo usarlos. Las farmacias generalmente imprimen las instrucciones del medicamento slo en las cajas y no directamente en los tubos del [place].   Si su medicamento es muy caro, por favor, pngase en contacto con Zigmund Daniel llamando al [phone] y presione la opcin 4 o envenos un mensaje a travs de Pharmacist, community.   No podemos decirle cul ser su copago por los medicamentos por adelantado ya que esto es diferente dependiendo de la cobertura de su seguro. Sin embargo, es posible que podamos encontrar un medicamento sustituto a Electrical engineer un formulario para que el seguro cubra el medicamento que se considera necesario.   Si se requiere una autorizacin previa para que su compaa de seguros [place] su medicamento, por favor permtanos de 1 a 2  das hbiles para completar [place].  Los precios de los medicamentos varan con frecuencia dependiendo del Environmental consultant de dnde se surte la receta y alguna farmacias pueden ofrecer precios ms baratos.  El sitio web www.goodrx.com tiene cupones para medicamentos de Airline pilot. Los precios aqu no tienen en cuenta lo que podra costar con la ayuda del seguro (puede ser ms barato con su seguro), pero el sitio web puede darle el precio si no utiliz Research scientist (physical sciences).  - Puede imprimir el cupn correspondiente y llevarlo con su receta a la farmacia.  - Tambin puede pasar por nuestra oficina durante el horario de atencin regular y Charity fundraiser una tarjeta de cupones de GoodRx.  - Si necesita que su receta se enve electrnicamente a una farmacia diferente, informe a nuestra oficina a travs de MyChart de [place] o por telfono llamando al [phone] y presione la opcin 4.

## 2021-06-15 ENCOUNTER — Encounter: Payer: Self-pay | Admitting: Dermatology

## 2021-06-19 ENCOUNTER — Telehealth: Payer: Self-pay

## 2021-06-19 LAB — ANATOMIC PATHOLOGY REPORT

## 2021-06-19 LAB — SPECIMEN STATUS REPORT

## 2021-06-19 NOTE — Telephone Encounter (Signed)
Advised pt of bx results/sh ?

## 2021-06-19 NOTE — Telephone Encounter (Signed)
-----   Message from Ralene Bathe, MD sent at 06/19/2021 12:01 PM EST ----- Diagnosis synopsis: Comment  Comment: Specimen 1-Skin Biopsy, Right Distal Lateral Tricep:  COMPOUND MELANOCYTIC NEVUS.   Benign mole No further treatment needed

## 2021-06-24 NOTE — Progress Notes (Signed)
Virtual Visit via Telephone Note  I connected with Lisa Barry on 06/26/21 at 10:30 AM EST by telephone and verified that I am speaking with the correct person using two identifiers.  Location: Patient: Scientist, clinical (histocompatibility and immunogenetics): Office   I discussed the limitations, risks, security and privacy concerns of performing an evaluation and management service by telephone and the availability of in person appointments. I also discussed with the patient that there may be a patient responsible charge related to this service. The patient expressed understanding and agreed to proceed.   History of Present Illness:  Urological history: 1. OAB/Urge incontinence  -contributing factors of age, obesity, hydrocephalus and stroke -managed with Myrbetriq 25 mg twice daily   She has been taking the Myrbetriq 25 mg 2 tablets at the same time.  She states that she is not have any further urinary leakage with taking the Myrbetriq 2 tablets a day.  Patient denies any modifying or aggravating factors.  Patient denies any gross hematuria, dysuria or suprapubic/flank pain.  Patient denies any fevers, chills, nausea or vomiting.     Observations/Objective: Patient answers questions appropriately and does not sound distressed.    Assessment and Plan: 1. OAB -We discussed taking the Myrbetriq 25 mg separately, 1 in the morning and 1 in the evening versus increase the Myrbetriq to 50 mg daily.  As she has recent refill Myrbetriq 25 mg she will go ahead and take the Myrbetriq 25 mg with 8 to 12 hours apart twice daily to see if her urinary symptoms are still at goal or even more improved -she will notified by MyChart whether she would like to continue Myrbetriq 25 mg BID or change to Myrbetriq 50 mg   Follow Up Instructions:  ~follow up in one year    I discussed the assessment and treatment plan with the patient. The patient was provided an opportunity to ask questions and all were answered. The patient agreed with the plan  and demonstrated an understanding of the instructions.   The patient was advised to call back or seek an in-person evaluation if the symptoms worsen or if the condition fails to improve as anticipated.  I provided 10 minutes of non-face-to-face time during this encounter.   Lalisa Kiehn, PA-C

## 2021-06-25 ENCOUNTER — Ambulatory Visit: Payer: Managed Care, Other (non HMO) | Admitting: Urology

## 2021-06-25 ENCOUNTER — Encounter: Payer: Self-pay | Admitting: Urology

## 2021-06-25 ENCOUNTER — Other Ambulatory Visit: Payer: Self-pay

## 2021-06-25 DIAGNOSIS — R32 Unspecified urinary incontinence: Secondary | ICD-10-CM

## 2021-07-05 ENCOUNTER — Telehealth: Payer: Self-pay | Admitting: Urology

## 2021-07-05 NOTE — Telephone Encounter (Signed)
Pt would like to know if Larene Beach could call in a prescription for the 50mg  tablets for Lisa Barry for her to take once a day instead of her currently taking 2 25mg  tablets a day. Pt would like a call back to know if you called [facility] in regards to this matter. Advised pt it may take from 24-48hrs to get back with her.

## 2021-07-08 ENCOUNTER — Other Ambulatory Visit: Payer: Self-pay | Admitting: Urology

## 2021-07-08 DIAGNOSIS — R32 Unspecified urinary incontinence: Secondary | ICD-10-CM

## 2021-07-08 MED ORDER — MIRABEGRON ER 50 MG PO TB24
50.0000 mg | ORAL_TABLET | Freq: Every day | ORAL | 3 refills | Status: DC
Start: 2021-07-08 — End: 2021-08-23

## 2021-07-08 NOTE — Telephone Encounter (Signed)
Spoke with patient and advised results   

## 2021-07-09 IMAGING — US US EXTREM LOW VENOUS*R*
1 series · 13 of 24 positions shown · non-contrast
Comparison: None available

CLINICAL DATA: History of right lower extremity DVT



[Series 1: us extrem low venous*right* · 0.08mm/px · 13 of 36 slices shown]
[im 1/36]
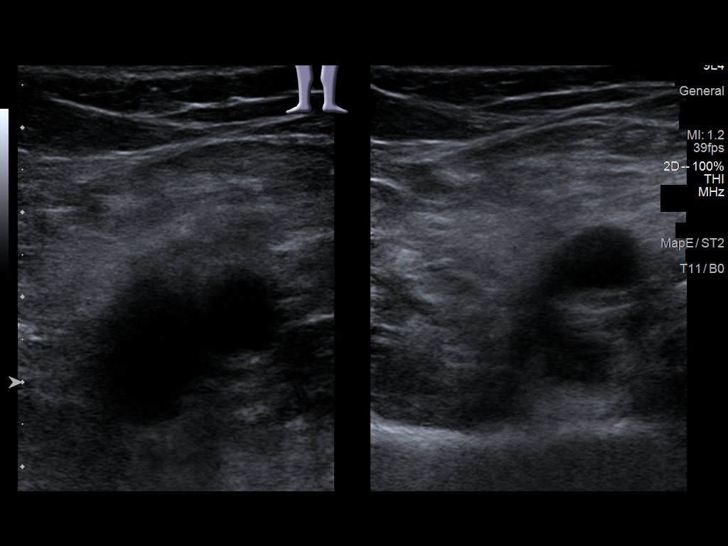
[im 4/36]
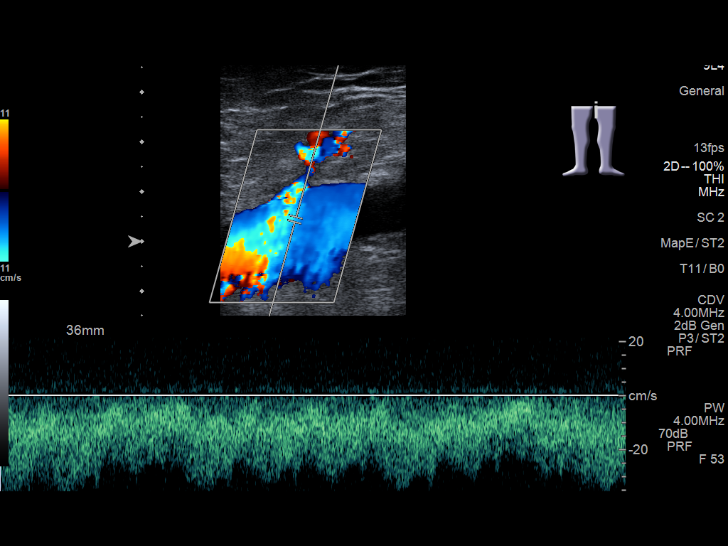
[im 7/36]
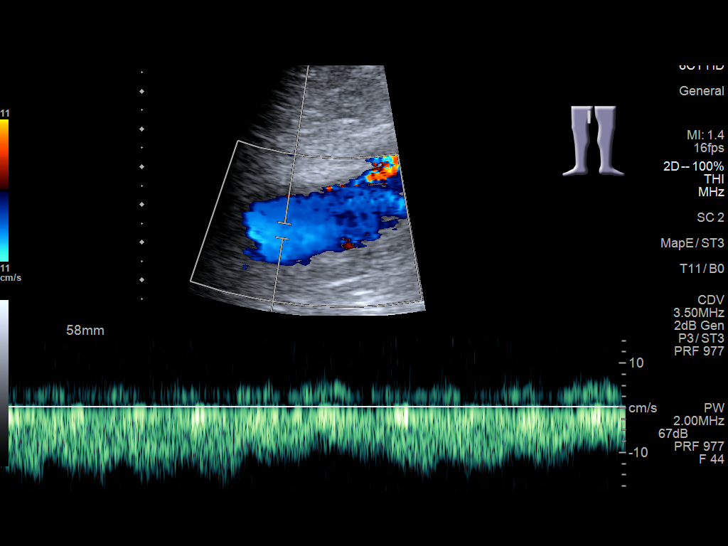
[im 10/36]
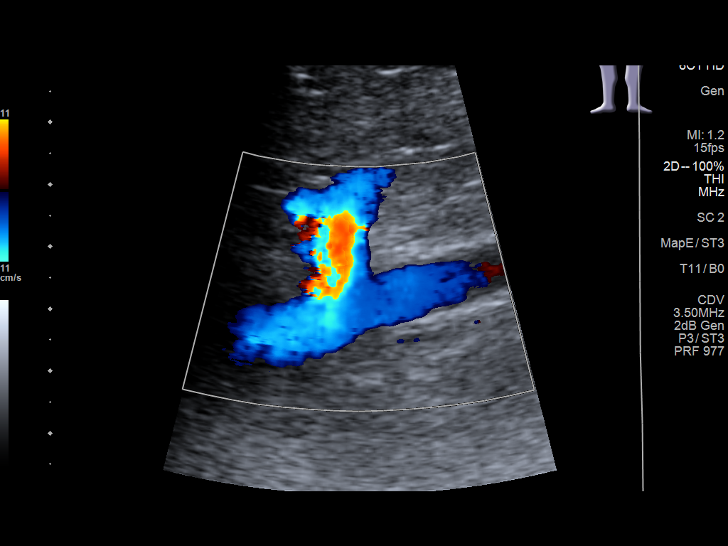
[im 13/36]
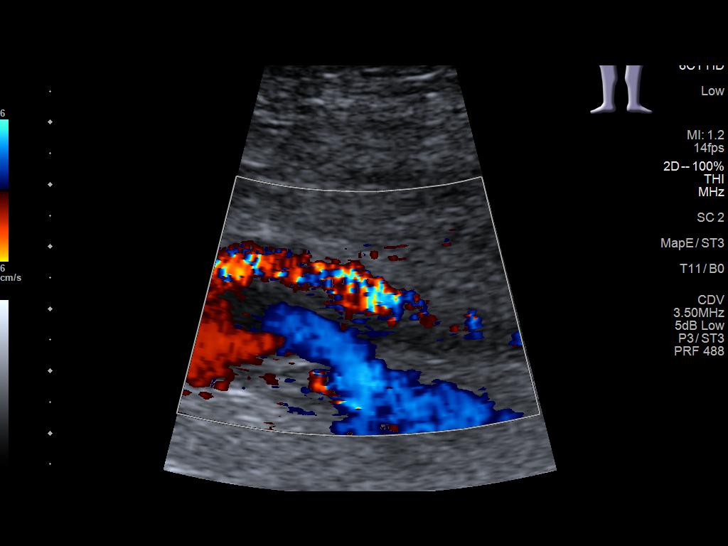
[im 16/36]
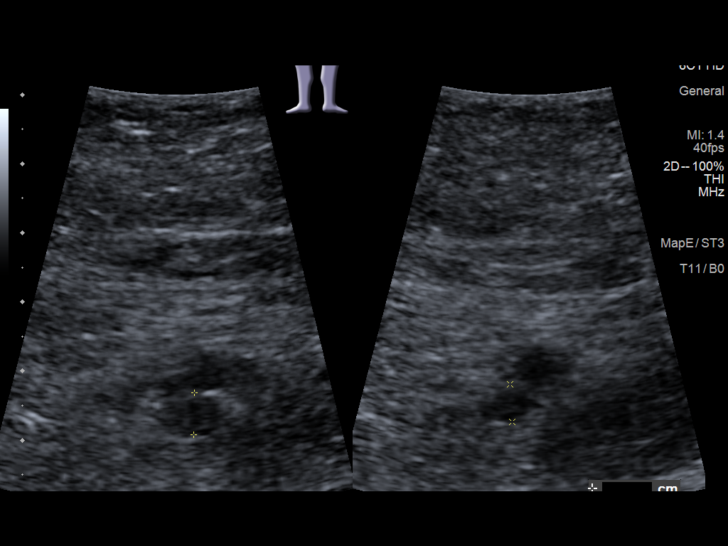
[im 19/36]
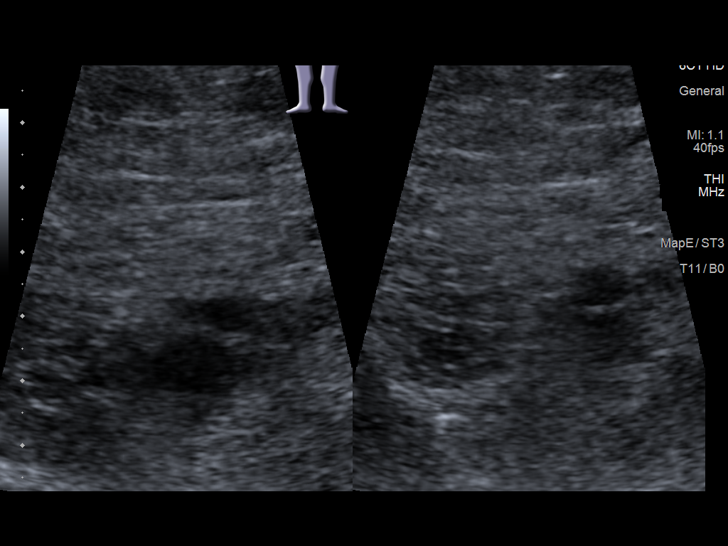
[im 20/36]
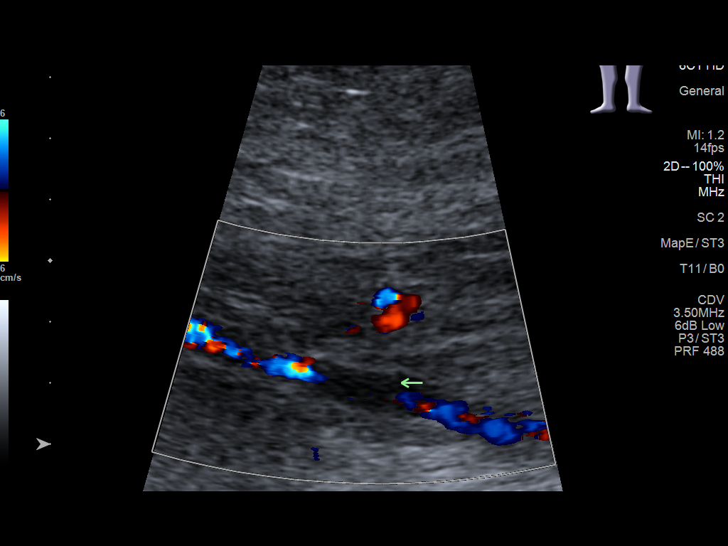
[im 23/36]
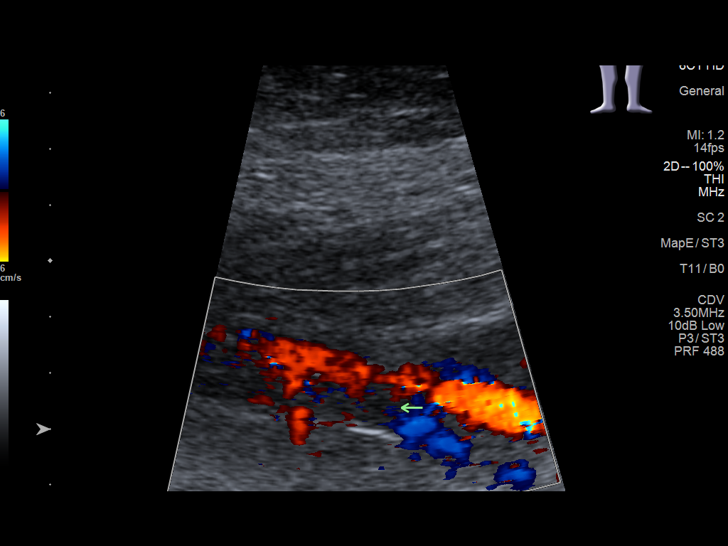
[im 26/36]
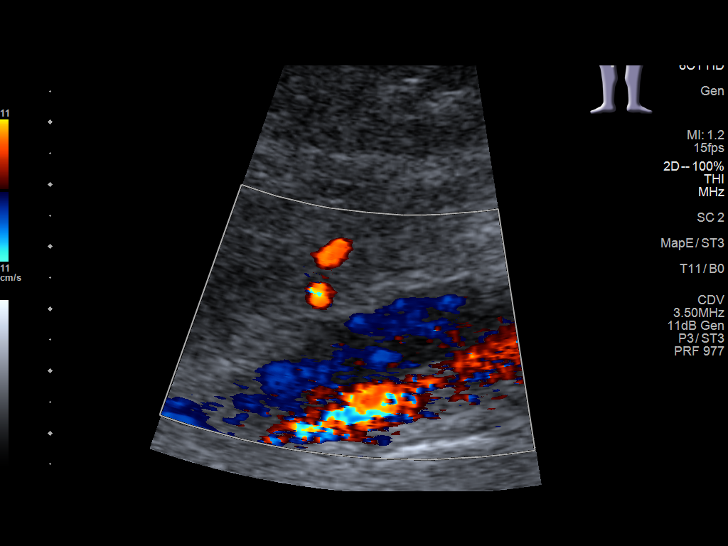
[im 29/36]
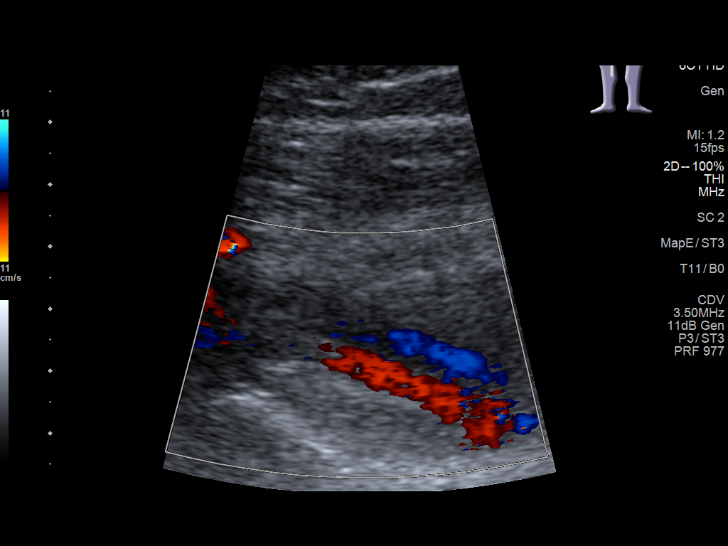
[im 32/36]
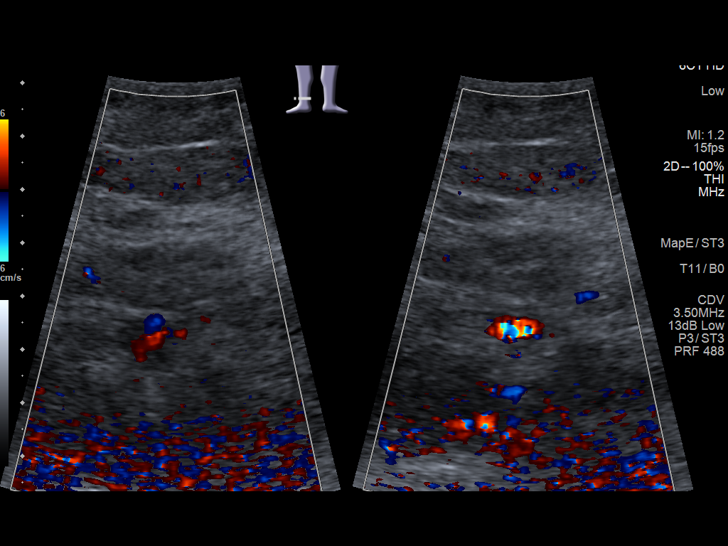
[im 36/36]
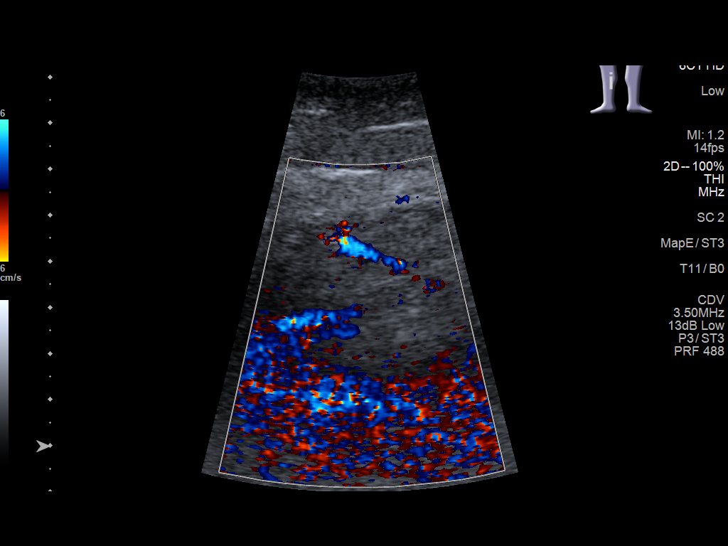

[13 of 24 positions shown; findings below may reference images not displayed]

FINDINGS: Contralateral Common Femoral Vein: Respiratory phasicity is normal
and symmetric with the symptomatic side. No evidence of thrombus.
Normal compressibility.

Common Femoral Vein: No evidence of thrombus. Normal
compressibility, respiratory phasicity and response to augmentation.

Saphenofemoral Junction: No evidence of thrombus. Normal
compressibility and flow on color Doppler imaging.

Profunda Femoral Vein: No evidence of thrombus. Normal
compressibility and flow on color Doppler imaging.

Femoral Vein: Scattered hypoechoic areas of thrombus throughout the
femoral vein with partial compressibility. Thrombus appears
nonocclusive. Appearance suggest partial recanalization.

Popliteal Vein: Hypoechoic thrombus noted. Vessel is partially
compressible. Thrombus is nonocclusive. Also suspect partial
recanalization from prior DVT.

Calf Veins: Very limited visualization of the calf veins. This is
secondary to edema.

Other Findings:  None.
IMPRESSION: Residual nonocclusive areas of femoropopliteal DVT, suggesting
partial recanalization.

## 2021-07-15 NOTE — Progress Notes (Unsigned)
07/16/2021 2:27 PM   Richanda Darin [facility] 14-Nov-1965 [id number]  Referring provider: Marinda Elk, MD [facility] [place],  [place] [address]  Chief Complaint  Patient presents with   Dysuria   Urological history: 1. OAB wet contributing factors of age, obesity, hydrocephalus and stroke -PVR 35 mL -managed with Myrbetriq 25 mg twice daily   HPI: Lisa Barry is a 56 y.o. female who presents today for leakage and odor from urine.  UA ***  PVR ***      PMH: Past Medical History:  Diagnosis Date   Blood clot associated with vein wall inflammation 2008   Factor V Leiden ([place]) 2008   Hypertension    TIA (transient ischemic attack) 11/2018    Surgical History: Past Surgical History:  Procedure Laterality Date   ABDOMINAL HYSTERECTOMY     BACK SURGERY     CHOLECYSTECTOMY     LUMBAR PERITONEAL SHUNT  2000    Home Medications:  Allergies as of 07/16/2021       Reactions   Ace Inhibitors Other (See Comments)   Corticosteroids Other (See Comments)   Eye condition states may or may not be the cause Eye condition states may or may not be the cause   Other Other (See Comments)   AVOID STEROIDS DUE TO PREVIOUS EYE CONDITION BIRTH CONTROL PILLS   Penicillins Other (See Comments)   Sulfa Antibiotics Other (See Comments)   Other reaction(s): Unknown Other reaction(s): Unknown   Tetracycline Other (See Comments)   Tetracyclines & Related    Other reaction(s): Unknown        Medication List        Accurate as of July 16, 2021  2:27 PM. If you have any questions, ask your nurse or doctor.          acyclovir ointment 5 % Commonly known as: Zovirax Apply to aa's QID   ALPRAZolam 0.5 MG tablet Commonly known as: XANAX Take 0.5 mg by mouth daily as needed.   amLODipine 10 MG tablet Commonly known as: NORVASC Take 5 mg by mouth.   atorvastatin 80 MG tablet Commonly known as: LIPITOR Take by mouth.   Ciclopirox 1 %  shampoo 2-3 times a week lather on scalp, leave on 5-8 minutes, rinse well.   citalopram 20 MG tablet Commonly known as: CELEXA Take by mouth.   Eliquis 2.5 MG Tabs tablet Generic drug: apixaban TAKE 1 TABLET BY MOUTH  TWICE DAILY   fluocinolone 0.01 % external solution Commonly known as: SYNALAR Apply to aa's scalp BID   hyoscyamine 0.375 MG 12 hr tablet Commonly known as: LEVBID   ketoconazole 2 % shampoo Commonly known as: NIZORAL Apply to scalp 2-3 times per week. Let sit 5-8 minutes before rinsing.   metoprolol succinate 25 MG 24 hr tablet Commonly known as: TOPROL-XL TAKE 1 TABLET BY MOUTH EVERY DAY   mirabegron ER 50 MG Tb24 tablet Commonly known as: MYRBETRIQ Take 1 tablet (50 mg total) by mouth daily.   mometasone 0.1 % lotion Commonly known as: ELOCON Apply qd-bid to affected areas on scalp   valACYclovir 500 MG tablet Commonly known as: VALTREX Take by mouth.   valsartan-hydrochlorothiazide 320-12.5 MG tablet Commonly known as: DIOVAN-HCT Take by mouth.   zolpidem 5 MG tablet Commonly known as: AMBIEN TAKE 1 TABLET BY MOUTH NIGHTLY AS NEEDED FOR SLEEP        Allergies:  Allergies  Allergen Reactions   Ace Inhibitors  Other (See Comments)   Corticosteroids Other (See Comments)    Eye condition states may or may not be the cause Eye condition states may or may not be the cause    Other Other (See Comments)    AVOID STEROIDS DUE TO PREVIOUS EYE CONDITION  BIRTH CONTROL PILLS   Penicillins Other (See Comments)   Sulfa Antibiotics Other (See Comments)    Other reaction(s): Unknown Other reaction(s): Unknown   Tetracycline Other (See Comments)   Tetracyclines & Related     Other reaction(s): Unknown    Family History: Family History  Problem Relation Age of Onset   Hypertension Mother    CAD Mother    COPD Mother    Bowel Disease Father    Stroke Father    Liver disease Sister    Bladder Cancer Neg Hx    Kidney cancer Neg Hx     Prostate cancer Neg Hx    Breast cancer Neg Hx     Social History:  reports that she has never smoked. She has never used smokeless tobacco. She reports current alcohol use of about 2.0 standard drinks per week. She reports that she does not use drugs.  ROS: Pertinent ROS in HPI  Physical Exam: There were no vitals taken for this visit.  Constitutional:  Well nourished. Alert and oriented, No acute distress. HEENT: [place] AT, moist mucus membranes.  Trachea midline, no masses. Cardiovascular: No clubbing, cyanosis, or edema. Respiratory: Normal respiratory effort, no increased work of breathing. GI: Abdomen is soft, non tender, non distended, no abdominal masses. Liver and spleen not palpable.  No hernias appreciated.  Stool sample for occult testing is not indicated.   GU: No CVA tenderness.  No bladder fullness or masses.  *** external genitalia, *** pubic hair distribution, no lesions.  Normal urethral meatus, no lesions, no prolapse, no discharge.   No urethral masses, tenderness and/or tenderness. No bladder fullness, tenderness or masses. *** vagina mucosa, *** estrogen effect, no discharge, no lesions, *** pelvic support, *** cystocele and *** rectocele noted.  No cervical motion tenderness.  Uterus is freely mobile and non-fixed.  No adnexal/parametria masses or tenderness noted.  Anus and perineum are without rashes or lesions.   ***  Skin: No rashes, bruises or suspicious lesions. Lymph: No cervical or inguinal adenopathy. Neurologic: Grossly intact, no focal deficits, moving all 4 extremities. Psychiatric: Normal mood and affect.    Laboratory Data: Urinalysis ***   I have reviewed the labs.   Pertinent Imaging: ***  Assessment & Plan:  ***  1. OAB wet ***  2. Suspected UTI ***  No follow-ups on file.  These notes generated with voice recognition software. I apologize for typographical errors.  Zara Council, PA-C  [facility] [address]  [place], [place] [address] [phone]

## 2021-07-16 ENCOUNTER — Ambulatory Visit: Payer: Managed Care, Other (non HMO) | Admitting: Urology

## 2021-07-16 ENCOUNTER — Other Ambulatory Visit: Payer: Self-pay

## 2021-07-16 ENCOUNTER — Encounter: Payer: Self-pay | Admitting: Urology

## 2021-07-16 VITALS — BP 126/87 | HR 98 | Ht 65.0 in | Wt 310.0 lb

## 2021-07-16 DIAGNOSIS — R32 Unspecified urinary incontinence: Secondary | ICD-10-CM

## 2021-07-16 DIAGNOSIS — N3281 Overactive bladder: Secondary | ICD-10-CM

## 2021-07-16 DIAGNOSIS — R3989 Other symptoms and signs involving the genitourinary system: Secondary | ICD-10-CM | POA: Diagnosis not present

## 2021-07-16 LAB — BLADDER SCAN AMB NON-IMAGING

## 2021-07-17 LAB — URINALYSIS, COMPLETE
Bilirubin, UA: NEGATIVE
Glucose, UA: NEGATIVE
Ketones, UA: NEGATIVE
Leukocytes,UA: NEGATIVE
Nitrite, UA: NEGATIVE
Protein,UA: NEGATIVE
Specific Gravity, UA: 1.015 (ref 1.005–1.030)
Urobilinogen, Ur: 0.2 mg/dL (ref 0.2–1.0)
pH, UA: 6.5 (ref 5.0–7.5)

## 2021-07-17 LAB — MICROSCOPIC EXAMINATION

## 2021-07-19 LAB — CULTURE, URINE COMPREHENSIVE

## 2021-08-06 ENCOUNTER — Other Ambulatory Visit: Payer: Self-pay | Admitting: Dermatology

## 2021-08-06 DIAGNOSIS — L219 Seborrheic dermatitis, unspecified: Secondary | ICD-10-CM

## 2021-08-09 ENCOUNTER — Ambulatory Visit: Payer: Managed Care, Other (non HMO) | Admitting: Urology

## 2021-08-22 NOTE — Progress Notes (Signed)
08/31/21 ?9:56 PM  ? ?[place] ?01-Dec-1965 ?[id number] ? ?Referring provider:  ?Marinda Elk, MD ?[address] ?[place],  [place] [address] ? ?Chief Complaint  ?Patient presents with  ? Over Active Bladder  ? ? ?Urological history  ?1. OAB wet ?- contributing factors of age, obesity, hydrocephalus and stroke ?- PVR 42m ?- Managed on trial of Gemtesa given 07/16/2021 ? ? ?HPI: ?Lisa FITZHENRYis a 56y.o.female who presents today for a 3 week follow-up on OAB with PVR.  ? ?She has 1-7 daytime voids (unchanged) and urinary nocturia 1-2x (unchanged) with strong urge (worse) to urinate,  She has urge incontinence and urinary leakage 3 or more x weekly (unchanged), she wears panty liners daily she engages in toilet mapping. ? ?She reports that her urine has had a strong odor that she notices more in the morning when she leaks at night. She attributes this to a possible infection. She has not been wearing pads nightly but she does during the day she changes this x2 daily. She had improvement on Gemtesa , but her insurance will not cover this medication. ? ?Patient denies any modifying or aggravating factors.  Patient denies any gross hematuria, dysuria or suprapubic/flank pain.  Patient denies any fevers, chills, nausea or vomiting.  ? ?UA today 6-10 WBCs, with >10 epithelial cells, and moderate bacteria.  ? ? ?PMH: ?Past Medical History:  ?Diagnosis Date  ? Blood clot associated with vein wall inflammation 2008  ? Factor V Leiden ([place] 2008  ? Hypertension   ? TIA (transient ischemic attack) 11/2018  ? ? ?Surgical History: ?Past Surgical History:  ?Procedure Laterality Date  ? ABDOMINAL HYSTERECTOMY    ? BACK SURGERY    ? CHOLECYSTECTOMY    ? LUMBAR PERITONEAL SHUNT  2000  ? ? ?Home Medications:  ?Allergies as of 08/23/2021   ? ?   Reactions  ? Ace Inhibitors Other (See Comments)  ? Corticosteroids Other (See Comments)  ? Eye condition states may or may not be the cause ?Eye condition  states may or may not be the cause  ? Other Other (See Comments)  ? AVOID STEROIDS DUE TO PREVIOUS EYE CONDITION ?BIRTH CONTROL PILLS  ? Penicillins Other (See Comments)  ? Sulfa Antibiotics Other (See Comments)  ? Other reaction(s): Unknown ?Other reaction(s): Unknown  ? Tetracycline Other (See Comments)  ? Tetracyclines & Related   ? Other reaction(s): Unknown  ? ?  ? ?  ?Medication List  ?  ? ?  ? Accurate as of August 23, 2021 11:59 PM. If you have any questions, ask your nurse or doctor.  ?  ?  ? ?  ? ?STOP taking these medications   ? ?mirabegron ER 50 MG Tb24 tablet ?Commonly known as: MYRBETRIQ ?  ? ?  ? ?TAKE these medications   ? ?acyclovir ointment 5 % ?Commonly known as: Zovirax ?Apply to aa's QID ?  ?ALPRAZolam 0.5 MG tablet ?Commonly known as: XDuanne Moron?Take 0.5 mg by mouth daily as needed. ?  ?amLODipine 10 MG tablet ?Commonly known as: NORVASC ?Take 5 mg by mouth. ?  ?atorvastatin 80 MG tablet ?Commonly known as: LIPITOR ?Take by mouth. ?  ?Ciclopirox 1 % shampoo ?2-3 times a week lather on scalp, leave on 5-8 minutes, rinse well. ?  ?citalopram 20 MG tablet ?Commonly known as: CELEXA ?Take by mouth. ?  ?Eliquis 2.5 MG Tabs tablet ?Generic drug: apixaban ?TAKE 1 TABLET BY MOUTH  TWICE DAILY ?  ?  fluocinolone 0.01 % external solution ?Commonly known as: SYNALAR ?Apply to aa's scalp BID ?  ?hyoscyamine 0.375 MG 12 hr tablet ?Commonly known as: LEVBID ?  ?ketoconazole 2 % shampoo ?Commonly known as: NIZORAL ?APPLY TO SCALP 2-3 TIMES PER WEEK. LET SIT 5-8 MINUTES BEFORE RINSING. ?  ?metoprolol succinate 25 MG 24 hr tablet ?Commonly known as: TOPROL-XL ?TAKE 1 TABLET BY MOUTH EVERY DAY ?  ?mometasone 0.1 % lotion ?Commonly known as: ELOCON ?Apply qd-bid to affected areas on scalp ?  ?ondansetron 4 MG tablet ?Commonly known as: ZOFRAN ?Take 4 mg by mouth every 8 (eight) hours as needed. ?  ?Trospium Chloride 60 MG Cp24 ?Take 1 capsule (60 mg total) by mouth daily. ?  ?valACYclovir 500 MG tablet ?Commonly known  as: VALTREX ?Take by mouth. ?  ?valsartan-hydrochlorothiazide 320-12.5 MG tablet ?Commonly known as: DIOVAN-HCT ?Take by mouth. ?  ?Wegovy 0.25 MG/0.5ML Soaj ?Generic drug: Semaglutide-Weight Management ?  ?zolpidem 5 MG tablet ?Commonly known as: AMBIEN ?TAKE 1 TABLET BY MOUTH NIGHTLY AS NEEDED FOR SLEEP ?  ? ?  ? ? ?Allergies:  ?Allergies  ?Allergen Reactions  ? Ace Inhibitors Other (See Comments)  ? Corticosteroids Other (See Comments)  ?  Eye condition states may or may not be the cause ?Eye condition states may or may not be the cause ?  ? Other Other (See Comments)  ?  AVOID STEROIDS DUE TO PREVIOUS EYE CONDITION ? ?BIRTH CONTROL PILLS  ? Penicillins Other (See Comments)  ? Sulfa Antibiotics Other (See Comments)  ?  Other reaction(s): Unknown ?Other reaction(s): Unknown  ? Tetracycline Other (See Comments)  ? Tetracyclines & Related   ?  Other reaction(s): Unknown  ? ? ?Family History: ?Family History  ?Problem Relation Age of Onset  ? Hypertension Mother   ? CAD Mother   ? COPD Mother   ? Bowel Disease Father   ? Stroke Father   ? Liver disease Sister   ? Bladder Cancer Neg Hx   ? Kidney cancer Neg Hx   ? Prostate cancer Neg Hx   ? Breast cancer Neg Hx   ? ? ?Social History:  reports that she has never smoked. She has never used smokeless tobacco. She reports current alcohol use of about 2.0 standard drinks per week. She reports that she does not use drugs. ? ? ?Physical Exam: ?BP 138/83   Pulse 97   Ht '5\' 5"'$  (1.651 m)   Wt (!) 307 lb (139.3 kg)   BMI 51.09 kg/m?   ?Constitutional:  Alert and oriented, No acute distress. ?HEENT: [place] AT, moist mucus membranes.  Trachea midline, no masses. ?Cardiovascular: No clubbing, cyanosis, or edema. ?Respiratory: Normal respiratory effort, no increased work of breathing. ?Neurologic: Grossly intact, no focal deficits, moving all 4 extremities. ?Psychiatric: Normal mood and affect. ? ? ?Laboratory Data: ?Urinalysis ?Component ?    Latest Ref Rng 08/23/2021  ?Glucose,  UA ?    Negative  Negative   ?Specific Gravity, UA ?    1.005 - 1.030  1.020   ?pH, UA ?    5.0 - 7.5  7.0   ?Color, UA ?    Yellow  Yellow   ?Appearance Ur ?    Clear  Clear   ?Leukocytes,UA ?    Negative  Trace !   ?[place] ?    Negative/Trace  Negative   ?Ketones, UA ?    Negative  Trace !   ?RBC, UA ?    Negative  Negative   ?  Bilirubin, UA ?    Negative  Negative   ?Urobilinogen, Ur ?    0.2 - 1.0 mg/dL 1.0   ?Nitrite, UA ?    Negative  Negative   ?Microscopic Examination See below:   ? ?Component ?    Latest Ref Rng 08/23/2021  ?RBC ?    0 - 2 /hpf 0-2   ?WBC, UA ?    0 - 5 /hpf 6-10 !   ?Bacteria, UA ?    None seen/Few  Moderate !   ?Epithelial Cells (non renal) ?    0 - 10 /hpf >10 !   ?I have reviewed the labs.   ? ? ? ?Pertinent Imaging: ?Results for orders placed or performed in visit on 08/23/21  ?CULTURE, URINE COMPREHENSIVE  ? Specimen: Urine  ? UR  ?Result Value Ref Range  ? Urine Culture, Comprehensive Final report (A)   ? Organism ID, Bacteria Escherichia coli (A)   ? ANTIMICROBIAL SUSCEPTIBILITY Comment   ?Microscopic Examination  ? Urine  ?Result Value Ref Range  ? WBC, UA 6-10 (A) 0 - 5 /hpf  ? RBC 0-2 0 - 2 /hpf  ? Epithelial Cells (non renal) >10 (A) 0 - 10 /hpf  ? Bacteria, UA Moderate (A) None seen/Few  ?Urinalysis, Complete  ?Result Value Ref Range  ? Specific Gravity, UA 1.020 1.005 - 1.030  ? pH, UA 7.0 5.0 - 7.5  ? Color, UA Yellow Yellow  ? Appearance Ur Clear Clear  ? Leukocytes,UA Trace (A) Negative  ? Protein,UA Negative Negative/Trace  ? Glucose, UA Negative Negative  ? Ketones, UA Trace (A) Negative  ? RBC, UA Negative Negative  ? Bilirubin, UA Negative Negative  ? Urobilinogen, Ur 1.0 0.2 - 1.0 mg/dL  ? Nitrite, UA Negative Negative  ? Microscopic Examination See below:   ?Bladder Scan (Post Void Residual) in office  ?Result Value Ref Range  ? Scan Result 18m   ? ? ?Assessment & Plan:   ? ?1. OAB wet ?- UA is bland  ?- She is emptying adequately today with a PVR of 0 ml  ?- She  had improvement on Gemtesa but had issues with her insurance covering this medication. Will try her on Trospium.   ?- Trospium Chloride 60 mg prescribed  ? ?2. Suspected UTI  ?- UA bland  ?- Her symptoms include

## 2021-08-23 ENCOUNTER — Ambulatory Visit: Payer: Managed Care, Other (non HMO) | Admitting: Urology

## 2021-08-23 VITALS — BP 138/83 | HR 97 | Ht 65.0 in | Wt 307.0 lb

## 2021-08-23 DIAGNOSIS — R3989 Other symptoms and signs involving the genitourinary system: Secondary | ICD-10-CM

## 2021-08-23 DIAGNOSIS — R82998 Other abnormal findings in urine: Secondary | ICD-10-CM

## 2021-08-23 DIAGNOSIS — N3281 Overactive bladder: Secondary | ICD-10-CM

## 2021-08-23 LAB — URINALYSIS, COMPLETE
Bilirubin, UA: NEGATIVE
Glucose, UA: NEGATIVE
Nitrite, UA: NEGATIVE
Protein,UA: NEGATIVE
RBC, UA: NEGATIVE
Specific Gravity, UA: 1.02 (ref 1.005–1.030)
Urobilinogen, Ur: 1 mg/dL (ref 0.2–1.0)
pH, UA: 7 (ref 5.0–7.5)

## 2021-08-23 LAB — MICROSCOPIC EXAMINATION: Epithelial Cells (non renal): >10 /hpf — AB (ref 0–10)

## 2021-08-23 LAB — BLADDER SCAN AMB NON-IMAGING

## 2021-08-23 MED ORDER — TROSPIUM CHLORIDE ER 60 MG PO CP24: 60.0000 mg | ORAL_CAPSULE | Freq: Every day | ORAL | 3 refills | Status: DC

## 2021-08-26 ENCOUNTER — Other Ambulatory Visit: Payer: Self-pay | Admitting: *Deleted

## 2021-08-26 ENCOUNTER — Telehealth: Payer: Self-pay | Admitting: Urology

## 2021-08-26 LAB — CULTURE, URINE COMPREHENSIVE

## 2021-08-26 MED ORDER — NITROFURANTOIN MONOHYD MACRO 100 MG PO CAPS: 100.0000 mg | ORAL_CAPSULE | Freq: Two times a day (BID) | ORAL | 0 refills | Status: AC

## 2021-08-26 NOTE — Telephone Encounter (Signed)
No information was needed. ?Spoke with patient and advised results ? ? ?

## 2021-08-26 NOTE — Telephone Encounter (Signed)
Lisa Barry called needing refill -  ?Trospium Chloride 60 MG CP24 ? ?She states [facility] is needing more information and to please call # [phone]. ?

## 2021-08-31 ENCOUNTER — Encounter: Payer: Self-pay | Admitting: Urology

## 2021-09-04 ENCOUNTER — Other Ambulatory Visit (INDEPENDENT_AMBULATORY_CARE_PROVIDER_SITE_OTHER): Payer: Self-pay | Admitting: Nurse Practitioner

## 2021-09-11 ENCOUNTER — Ambulatory Visit: Payer: Managed Care, Other (non HMO) | Admitting: Dermatology

## 2021-09-11 DIAGNOSIS — L820 Inflamed seborrheic keratosis: Secondary | ICD-10-CM

## 2021-09-11 DIAGNOSIS — L719 Rosacea, unspecified: Secondary | ICD-10-CM | POA: Diagnosis not present

## 2021-09-11 MED ORDER — METRONIDAZOLE 0.75 % EX LOTN: TOPICAL_LOTION | CUTANEOUS | 11 refills | Status: DC

## 2021-09-11 NOTE — Patient Instructions (Signed)
Cryotherapy Aftercare ? ?Wash gently with soap and water everyday.   ?Apply Vaseline and Band-Aid daily until healed.  ? ?Recommend daily broad spectrum sunscreen SPF 30+ to sun-exposed areas, reapply every 2 hours as needed. Call for new or changing lesions.  ?Staying in the shade or wearing long sleeves, sun glasses (UVA+UVB protection) and wide brim hats (4-inch brim around the entire circumference of the hat) are also recommended for sun protection.  ? ?If You Need Anything After Your Visit ? ?If you have any questions or concerns for your doctor, please call our main line at [phone] and press option 4 to reach your doctor's medical assistant. If no one answers, please leave a voicemail as directed and we will return your call as soon as possible. Messages left after 4 pm will be answered the following business day.  ? ?You may also send [place] a message via MyChart. We typically respond to MyChart messages within 1-2 business days. ? ?For prescription refills, please ask your pharmacy to contact our office. Our fax number is [phone]. ? ?If you have an urgent issue when the clinic is closed that cannot wait until the next business day, you can page your doctor at the number below.   ? ?Please note that while we do our best to be available for urgent issues outside of office hours, we are not available 24/7.  ? ?If you have an urgent issue and are unable to reach [place], you may choose to seek medical care at your doctor's office, retail clinic, urgent care center, or emergency room. ? ?If you have a medical emergency, please immediately call 911 or go to the emergency department. ? ?Pager Numbers ? ?- Dr. Nehemiah Massed: [phone] ? ?- Dr. Laurence Ferrari: [phone] ? ?- Dr. Nicole Kindred: [phone] ? ?In the event of inclement weather, please call our main line at [phone] for an update on the status of any delays or closures. ? ?Dermatology Medication Tips: ?Please keep the boxes that topical medications come in in  order to help keep track of the instructions about where and how to use these. Pharmacies typically print the medication instructions only on the boxes and not directly on the medication tubes.  ? ?If your medication is too expensive, please contact our office at [phone] option 4 or send [place] a message through [place].  ? ?We are unable to tell what your co-pay for medications will be in advance as this is different depending on your insurance coverage. However, we may be able to find a substitute medication at lower cost or fill out paperwork to get insurance to cover a needed medication.  ? ?If a prior authorization is required to get your medication covered by your insurance company, please allow [place] 1-2 business days to complete this process. ? ?Drug prices often vary depending on where the prescription is filled and some pharmacies may offer cheaper prices. ? ?The website www.goodrx.com contains coupons for medications through different pharmacies. The prices here do not account for what the cost may be with help from insurance (it may be cheaper with your insurance), but the website can give you the price if you did not use any insurance.  ?- You can print the associated coupon and take it with your prescription to the pharmacy.  ?- You may also stop by our office during regular business hours and pick up a GoodRx coupon card.  ?- If you need your prescription sent electronically to a different pharmacy, notify our office through [facility]  Health MyChart or by phone at [phone] option 4. ? ? ? ? ?Si Usted Necesita Algo Despu?s de Su Visita ? ?Tambi?n puede enviarnos un mensaje a trav?s de MyChart. Por lo general respondemos a los mensajes de MyChart en el transcurso de 1 a 2 d?as h?biles. ? ?Para renovar recetas, por favor pida a su farmacia que se ponga en contacto con nuestra oficina. Nuestro n?mero de fax es el [phone]. ? ?Si tiene un asunto urgente cuando la cl?nica est? cerrada y que no puede  esperar hasta el siguiente d?a h?bil, puede llamar/localizar a su doctor(a) al n?mero que aparece a continuaci?n.  ? ?Por favor, tenga en cuenta que aunque hacemos todo lo posible para estar disponibles para asuntos urgentes fuera del horario de oficina, no estamos disponibles las 24 horas del d?a, los 7 d?as de la semana.  ? ?Si tiene un problema urgente y no puede comunicarse con nosotros, puede optar por buscar atenci?n m?dica  en el consultorio de su doctor(a), en una cl?nica privada, en un centro de atenci?n urgente o en una sala de emergencias. ? ?Si tiene Engineer, maintenance (IT) m?dica, por favor llame inmediatamente al 911 o vaya a la sala de emergencias. ? ?N?meros de b?per ? ?- Dr. Nehemiah Massed: [phone] ? ?- Dra. Moye: [phone] ? ?- Dra. Nicole Kindred: [phone] ? ?En caso de inclemencias del tiempo, por favor llame a nuestra l?nea principal al [phone] para una actualizaci?n sobre el estado de cualquier retraso o cierre. ? ?Consejos para la medicaci?n en dermatolog?a: ?Por favor, guarde las cajas en las que vienen los medicamentos de uso t?pico para ayudarle a seguir las instrucciones sobre d?nde y c?mo usarlos. Las farmacias generalmente imprimen las instrucciones del medicamento s?lo en las cajas y no directamente en los tubos del [place].  ? ?Si su medicamento es muy caro, por favor, p?ngase en contacto con Zigmund Daniel llamando al [phone] y presione la opci?n 4 o env?enos un mensaje a trav?s de MyChart.  ? ?No podemos decirle cu?l ser? su copago por los medicamentos por adelantado ya que esto es diferente dependiendo de la cobertura de su seguro. Sin embargo, es posible que podamos encontrar un medicamento sustituto a Electrical engineer un formulario para que el seguro cubra el medicamento que se considera necesario.  ? ?Si se requiere Ardelia Mems autorizaci?n previa para que su compa??a de seguros [place] su medicamento, por favor perm?tanos de 1 a 2 d?as h?biles para completar este proceso. ? ?Los  precios de los medicamentos var?an con frecuencia dependiendo del Environmental consultant de d?nde se surte la receta y alguna farmacias pueden ofrecer precios m?s baratos. ? ?El sitio web www.goodrx.com tiene cupones para medicamentos de Airline pilot. Los precios aqu? no tienen en cuenta lo que podr?a costar con la ayuda del seguro (puede ser m?s barato con su seguro), pero el sitio web puede darle el precio si no utiliz? ning?n seguro.  ?- Puede imprimir el cup?n correspondiente y llevarlo con su receta a la farmacia.  ?- Tambi?n puede pasar por nuestra oficina durante el horario de atenci?n regular y recoger una tarjeta de cupones de GoodRx.  ?- Si necesita que su receta se env?e electr?nicamente a Chiropodist, informe a nuestra oficina a trav?s de MyChart de [place] o por tel?fono llamando al [phone] y presione la opci?n 4. ? ?

## 2021-09-11 NOTE — Progress Notes (Signed)
? ?  Follow-Up Visit ?  ?Subjective  ?Lisa Barry is a 56 y.o. female who presents for the following: Rash (Patient here today for a spot at right nipple, started itching about 2 weeks ago. Patient was scratching at it and it developed a crust so when she was at PCP's office they looked at it and gave patient TMC 0.1% cream to use on it. TMC 0.1% cream has helped but there is still a little spot. ). ? ?Patient would also like to get a refill for metronidazole 0.75% lotion for her rosacea which helps. ? ?The following portions of the chart were reviewed this encounter and updated as appropriate:  ?  ?  ? ?Review of Systems:  No other skin or systemic complaints except as noted in HPI or Assessment and Plan. ? ?Objective  ?Well appearing patient in no apparent distress; mood and affect are within normal limits. ? ?A focused examination was performed including face, breast. Relevant physical exam findings are noted in the Assessment and Plan. ? ?face ?Mild erythema at malar cheeks and chin ? ?right inferior areola x 1, right abdomen x 1 (2) ?1.2 cm waxy light pink tan scaly flat papule at right breast ?Waxy tan papule with erythema R abd ? ? ? ?Assessment & Plan  ?Rosacea ?face ? ?Chronic condition with duration or expected duration over one year. Currently well-controlled.  ? ?Rosacea is a chronic progressive skin condition usually affecting the face of adults, causing redness and/or acne bumps. It is treatable but not curable. It sometimes affects the eyes (ocular rosacea) as well. It may respond to topical and/or systemic medication and can flare with stress, sun exposure, alcohol, exercise and some foods.  Daily application of broad spectrum spf 30+ sunscreen to face is recommended to reduce flares. ? ?Continue metronidazole 0.75% lotion 1-2 times daily as needed for rosacea.  ? ?METRONIDAZOLE, TOPICAL, 0.75 % LOTN - face ?Apply 1-2 times daily as needed for rosacea ? ?Inflamed seborrheic keratosis (2) ?right  inferior areola x 1, right abdomen x 1 ? ?Destruction of lesion - right inferior areola x 1, right abdomen x 1 ? ?Destruction method: cryotherapy   ?Informed consent: discussed and consent obtained   ?Lesion destroyed using liquid nitrogen: Yes   ?Region frozen until ice ball extended beyond lesion: Yes   ?Outcome: patient tolerated procedure well with no complications   ?Post-procedure details: wound care instructions given   ?Additional details:  Prior to procedure, discussed risks of blister formation, small wound, skin dyspigmentation, or rare scar following cryotherapy. Recommend Vaseline ointment to treated areas while healing.  ? ? ?Return in about 2 months (around 11/11/2021) for ISK follow up. ? ?Graciella Belton, RMA, am acting as scribe for Brendolyn Patty, MD . ? ?Documentation: I have reviewed the above documentation for accuracy and completeness, and I agree with the above. ? ?Brendolyn Patty MD  ? ?

## 2021-10-04 ENCOUNTER — Ambulatory Visit: Payer: Managed Care, Other (non HMO) | Admitting: Urology

## 2021-10-04 ENCOUNTER — Telehealth: Payer: Self-pay | Admitting: Urology

## 2021-10-04 ENCOUNTER — Encounter: Payer: Self-pay | Admitting: Urology

## 2021-10-04 VITALS — BP 127/85 | HR 88 | Ht 65.0 in | Wt 305.0 lb

## 2021-10-04 DIAGNOSIS — N3281 Overactive bladder: Secondary | ICD-10-CM | POA: Diagnosis not present

## 2021-10-04 DIAGNOSIS — R8290 Unspecified abnormal findings in urine: Secondary | ICD-10-CM | POA: Diagnosis not present

## 2021-10-04 LAB — URINALYSIS, COMPLETE
Bilirubin, UA: NEGATIVE
Glucose, UA: NEGATIVE
Ketones, UA: NEGATIVE
Leukocytes,UA: NEGATIVE
Nitrite, UA: NEGATIVE
Protein,UA: NEGATIVE
RBC, UA: NEGATIVE
Specific Gravity, UA: 1.025 (ref 1.005–1.030)
Urobilinogen, Ur: 0.2 mg/dL (ref 0.2–1.0)
pH, UA: 6.5 (ref 5.0–7.5)

## 2021-10-04 LAB — MICROSCOPIC EXAMINATION: Bacteria, UA: NONE SEEN

## 2021-10-04 MED ORDER — MIRABEGRON ER 50 MG PO TB24: 50.0000 mg | ORAL_TABLET | Freq: Every day | ORAL | 3 refills | Status: DC

## 2021-10-04 NOTE — Progress Notes (Signed)
10/04/21 10:54 AM   Lisa Barry [birth year] [id number]  Referring provider:  Marinda Elk, MD [facility] [place],  [place] [address]   Urological history  1. OAB wet  - contributing factors of age, obesity, hydrocephalus and stroke  - PVR 100 mL  - Managed on trial of Gemtesa given 07/16/2021 - cost prohibitive   HPI: Lisa Barry is a 56 y.o.female who presents today for a 6 week follow-up for OAB and PVR.   She did not circle any symptoms on her ROS sheet.   She has daytime and nighttime frequency. She voids 1-2 during the night. She has a mild urge to urinate she leaks urine when she cant make it to the restroom. She leaks urine 1-2 x daily, she wear 3 panty liners daily and she engages in toilet mapping   She reports that trospium caused her dry mouth and she would like to go back on Myrbetriq.   She continues to have a strong smell to her urine.  She has been douching with Betadine to feel cleaner.   Patient denies any modifying or aggravating factors.  Patient denies any gross hematuria, dysuria or suprapubic/flank pain.  Patient denies any fevers, chills, nausea or vomiting.    PMH: Past Medical History:  Diagnosis Date   Blood clot associated with vein wall inflammation 2008   Factor V Leiden ([place]) 2008   Hypertension    TIA (transient ischemic attack) 11/2018    Surgical History: Past Surgical History:  Procedure Laterality Date   ABDOMINAL HYSTERECTOMY     BACK SURGERY     CHOLECYSTECTOMY     LUMBAR PERITONEAL SHUNT  2000    Home Medications:  Allergies as of 10/04/2021       Reactions   Ace Inhibitors Other (See Comments)   Corticosteroids Other (See Comments)   Eye condition states may or may not be the cause Eye condition states may or may not be the cause   Other Other (See Comments)   AVOID STEROIDS DUE TO PREVIOUS EYE CONDITION BIRTH CONTROL PILLS   Penicillins Other (See Comments)   Sulfa Antibiotics  Other (See Comments)   Other reaction(s): Unknown Other reaction(s): Unknown   Tetracycline Other (See Comments)   Tetracyclines & Related    Other reaction(s): Unknown        Medication List        Accurate as of Oct 04, 2021 10:54 AM. If you have any questions, ask your nurse or doctor.          STOP taking these medications    Trospium Chloride 60 MG Cp24       TAKE these medications    acyclovir ointment 5 % Commonly known as: Zovirax Apply to aa's QID   ALPRAZolam 0.5 MG tablet Commonly known as: XANAX Take 0.5 mg by mouth daily as needed.   amLODipine 10 MG tablet Commonly known as: NORVASC Take 5 mg by mouth.   atorvastatin 80 MG tablet Commonly known as: LIPITOR Take by mouth.   Ciclopirox 1 % shampoo 2-3 times a week lather on scalp, leave on 5-8 minutes, rinse well.   citalopram 20 MG tablet Commonly known as: CELEXA Take by mouth.   Eliquis 2.5 MG Tabs tablet Generic drug: apixaban TAKE 1 TABLET BY MOUTH  TWICE DAILY   fluocinolone 0.01 % external solution Commonly known as: SYNALAR Apply to aa's scalp BID   hyoscyamine 0.375 MG 12 hr tablet Commonly  known as: LEVBID   ketoconazole 2 % shampoo Commonly known as: NIZORAL APPLY TO SCALP 2-3 TIMES PER WEEK. LET SIT 5-8 MINUTES BEFORE RINSING.   metoprolol succinate 25 MG 24 hr tablet Commonly known as: TOPROL-XL TAKE 1 TABLET BY MOUTH EVERY DAY   METRONIDAZOLE (TOPICAL) 0.75 % Lotn Apply 1-2 times daily as needed for rosacea   mirabegron ER 50 MG Tb24 tablet Commonly known as: MYRBETRIQ Take 1 tablet (50 mg total) by mouth daily.   mometasone 0.1 % lotion Commonly known as: ELOCON Apply qd-bid to affected areas on scalp   ondansetron 4 MG tablet Commonly known as: ZOFRAN Take 4 mg by mouth every 8 (eight) hours as needed.   valACYclovir 500 MG tablet Commonly known as: VALTREX Take by mouth.   valsartan-hydrochlorothiazide 320-12.5 MG tablet Commonly known as:  DIOVAN-HCT Take by mouth.   Wegovy 0.25 MG/0.5ML Soaj Generic drug: Semaglutide-Weight Management   zolpidem 5 MG tablet Commonly known as: AMBIEN TAKE 1 TABLET BY MOUTH NIGHTLY AS NEEDED FOR SLEEP        Allergies:  Allergies  Allergen Reactions   Ace Inhibitors Other (See Comments)   Corticosteroids Other (See Comments)    Eye condition states may or may not be the cause Eye condition states may or may not be the cause    Other Other (See Comments)    AVOID STEROIDS DUE TO PREVIOUS EYE CONDITION  BIRTH CONTROL PILLS   Penicillins Other (See Comments)   Sulfa Antibiotics Other (See Comments)    Other reaction(s): Unknown Other reaction(s): Unknown   Tetracycline Other (See Comments)   Tetracyclines & Related     Other reaction(s): Unknown    Family History: Family History  Problem Relation Age of Onset   Hypertension Mother    CAD Mother    COPD Mother    Bowel Disease Father    Stroke Father    Liver disease Sister    Bladder Cancer Neg Hx    Kidney cancer Neg Hx    Prostate cancer Neg Hx    Breast cancer Neg Hx     Social History:  reports that she has never smoked. She has never used smokeless tobacco. She reports current alcohol use of about 2.0 standard drinks per week. She reports that she does not use drugs.   Physical Exam: BP 127/85   Pulse 88   Ht '5\' 5"'$  (1.651 m)   Wt (!) 305 lb (138.3 kg)   BMI 50.75 kg/m   Constitutional:  Well nourished. Alert and oriented, No acute distress. HEENT: [place] AT, moist mucus membranes.  Trachea midline  Cardiovascular: No clubbing, cyanosis, or edema. Respiratory: Normal respiratory effort, no increased work of breathing. GI: Abdomen is soft, non tender, non distended, no abdominal masses. Liver and spleen not palpable.  No hernias appreciated.  Stool sample for occult testing is not indicated.   GU: No CVA tenderness.  No bladder fullness or masses.  Normal external genitalia, normal pubic hair distribution, no  lesions.  Normal urethral meatus, no lesions, no prolapse, no discharge.   No urethral masses, tenderness and/or tenderness. No bladder fullness, tenderness or masses. Pale vagina mucosa.   Neurologic: Grossly intact, no focal deficits, moving all 4 extremities. Psychiatric: Normal mood and affect.    Laboratory Data: Urinalysis pending  Pertinent Imaging: No results found for any visits on 10/04/21.  In and Out Catheterization  Patient is present today for a I & O catheterization due to malodorous urine. Patient  was cleaned and prepped in a sterile fashion with betadine . A 14 FR red robinson cath was  inserted no complications were noted , 111m of urine return was noted, urine was yellow/clear in color. A clean urine sample was collected for urinalysis . Bladder was drained  And catheter was removed with out difficulty.    Assessment & Plan:    1. OAB wet - She experienced severe dry mouth on trospium she is interested on going back on the myrbetriq again today  - Myrbetriq 50 mg; prescribed  - Patient will follow-up in 1 year for OAB questionnaire and PVR.   2. Malodorous urine  - Patient cathed for specimen. Will call her with results.  - Urine culture; pending  - discouraged douching   Return in about 1 year (around 10/05/2022) for PVR and OAB questionnaire.  [address] [place] [phone] IPark [facility] LLCas a scribe for [facility] PA-C.,have documented all relevant documentation on the behalf of Jaedan Huttner, PA-C,as directed by  [facility] PA-C while in the presence of Montel Vanderhoof, PA-C.

## 2021-10-04 NOTE — Telephone Encounter (Signed)
Notified pt of message below, pt expressed understanding.

## 2021-10-04 NOTE — Telephone Encounter (Signed)
Please let Mrs. Calcaterra know that her UA was negative for infection.  I would have her increase her fluids and discontinue douching.

## 2021-10-08 ENCOUNTER — Telehealth: Payer: Self-pay

## 2021-10-08 LAB — CULTURE, URINE COMPREHENSIVE

## 2021-10-08 NOTE — Telephone Encounter (Signed)
-----   Message from Nori Riis, PA-C sent at 10/08/2021 12:29 PM EDT ----- Please let Mrs. Christianson know that her urine culture was negative for infection.

## 2021-10-08 NOTE — Telephone Encounter (Signed)
Notified pt as advised, pt expressed understanding.  ?

## 2021-11-19 ENCOUNTER — Ambulatory Visit: Payer: Managed Care, Other (non HMO) | Admitting: Dermatology

## 2021-11-19 DIAGNOSIS — L821 Other seborrheic keratosis: Secondary | ICD-10-CM | POA: Diagnosis not present

## 2021-11-19 DIAGNOSIS — L820 Inflamed seborrheic keratosis: Secondary | ICD-10-CM

## 2021-11-19 DIAGNOSIS — L304 Erythema intertrigo: Secondary | ICD-10-CM | POA: Diagnosis not present

## 2021-11-19 MED ORDER — PIMECROLIMUS 1 % EX CREA: TOPICAL_CREAM | CUTANEOUS | 1 refills | Status: DC

## 2021-11-19 NOTE — Progress Notes (Signed)
   Follow-Up Visit   Subjective  Lisa Barry is a 56 y.o. female who presents for the following: Follow-up.  Patient here for 2 month follow-up ISK of the right inferior areola. Area is improved after cryotherapy, with mild itch remaining. She also has an irritation at the gluteal cleft x 1 week, itchy. She has used Zeasorb powder in this area. Patient also has new growths on her back to check.   The following portions of the chart were reviewed this encounter and updated as appropriate:       Review of Systems:  No other skin or systemic complaints except as noted in HPI or Assessment and Plan.  Objective  Well appearing patient in no apparent distress; mood and affect are within normal limits.  A focused examination was performed including face, breast, trunk. Relevant physical exam findings are noted in the Assessment and Plan.  Right Inferior Breast Waxy tan patch with central clearing  Gluteal Crease Mild erythema of the gluteal cleft crease    Assessment & Plan  Seborrheic Keratoses - Stuck-on, waxy, tan-brown papules and/or plaques, L post axilla and L post flank  - Benign-appearing - Discussed benign etiology and prognosis. - Observe - Call for any changes  Inflamed seborrheic keratosis Right Inferior Breast  Small residual s/p cryotherapy treatment. No further treatment since not bothersome. Observe.  Intertrigo Gluteal Crease  Mild flare  Intertrigo is a chronic recurrent rash that occurs in skin fold areas that may be associated with friction; heat; moisture; yeast; fungus; and bacteria.  It is exacerbated by increased movement / activity; sweating; and higher atmospheric temperature.  Start pimecrolimus cream Apply to AA qd/bid until improved dsp 30g 1Rf. If not covered, will start hydrocortisone 1% cream bid- pt has.  Desitin original or Vaseline ointment to AA for barrier cream.  May also continue Zeasorb powder prn     pimecrolimus (ELIDEL) 1  % cream - Gluteal Crease Apply to irritation at skin fold once to twice daily until improved.   Return as scheduled.  IJamesetta Orleans, CMA, am acting as scribe for Brendolyn Patty, MD .  Documentation: I have reviewed the above documentation for accuracy and completeness, and I agree with the above.  Brendolyn Patty MD

## 2021-11-19 NOTE — Patient Instructions (Addendum)
Seborrheic Keratosis  What causes seborrheic keratoses? Seborrheic keratoses are harmless, common skin growths that first appear during adult life.  As time goes by, more growths appear.  Some people may develop a large number of them.  Seborrheic keratoses appear on both covered and uncovered body parts.  They are not caused by sunlight.  The tendency to develop seborrheic keratoses can be inherited.  They vary in color from skin-colored to gray, brown, or even black.  They can be either smooth or have a rough, warty surface.   Seborrheic keratoses are superficial and look as if they were stuck on the skin.  Under the microscope this type of keratosis looks like layers upon layers of skin.  That is why at times the top layer may seem to fall off, but the rest of the growth remains and re-grows.    Treatment Seborrheic keratoses do not need to be treated, but can easily be removed in the office.  Seborrheic keratoses often cause symptoms when they rub on clothing or jewelry.  Lesions can be in the way of shaving.  If they become inflamed, they can cause itching, soreness, or burning.  Removal of a seborrheic keratosis can be accomplished by freezing, burning, or surgery. If any spot bleeds, scabs, or grows rapidly, please return to have it checked, as these can be an indication of a skin cancer.  Intertrigo is a chronic recurrent rash that occurs in skin fold areas that may be associated with friction; heat; moisture; yeast; fungus; and bacteria.  It is exacerbated by increased movement / activity; sweating; and higher atmospheric temperature.   Due to recent changes in healthcare laws, you may see results of your pathology and/or laboratory studies on MyChart before the doctors have had a chance to review them. We understand that in some cases there may be results that are confusing or concerning to you. Please understand that not all results are received at the same time and often the doctors may need  to interpret multiple results in order to provide you with the best plan of care or course of treatment. Therefore, we ask that you please give [place] 2 business days to thoroughly review all your results before contacting the office for clarification. Should we see a critical lab result, you will be contacted sooner.   If You Need Anything After Your Visit  If you have any questions or concerns for your doctor, please call our main line at [phone] and press option 4 to reach your doctor's medical assistant. If no one answers, please leave a voicemail as directed and we will return your call as soon as possible. Messages left after 4 pm will be answered the following business day.   You may also send [place] a message via [place]. We typically respond to MyChart messages within 1-2 business days.  For prescription refills, please ask your pharmacy to contact our office. Our fax number is [phone].  If you have an urgent issue when the clinic is closed that cannot wait until the next business day, you can page your doctor at the number below.    Please note that while we do our best to be available for urgent issues outside of office hours, we are not available 24/7.   If you have an urgent issue and are unable to reach [place], you may choose to seek medical care at your doctor's office, retail clinic, urgent care center, or emergency room.  If you have a medical emergency, please  immediately call 911 or go to the emergency department.  Pager Numbers  - Dr. Nehemiah Massed: [phone]  - Dr. Laurence Ferrari: [phone]  - Dr. Nicole Kindred: [phone]  In the event of inclement weather, please call our main line at [phone] for an update on the status of any delays or closures.  Dermatology Medication Tips: Please keep the boxes that topical medications come in in order to help keep track of the instructions about where and how to use these. Pharmacies typically print the medication instructions only on  the boxes and not directly on the medication tubes.   If your medication is too expensive, please contact our office at [phone] option 4 or send [place] a message through [place].   We are unable to tell what your co-pay for medications will be in advance as this is different depending on your insurance coverage. However, we may be able to find a substitute medication at lower cost or fill out paperwork to get insurance to cover a needed medication.   If a prior authorization is required to get your medication covered by your insurance company, please allow [place] 1-2 business days to complete this process.  Drug prices often vary depending on where the prescription is filled and some pharmacies may offer cheaper prices.  The website www.goodrx.com contains coupons for medications through different pharmacies. The prices here do not account for what the cost may be with help from insurance (it may be cheaper with your insurance), but the website can give you the price if you did not use any insurance.  - You can print the associated coupon and take it with your prescription to the pharmacy.  - You may also stop by our office during regular business hours and pick up a GoodRx coupon card.  - If you need your prescription sent electronically to a different pharmacy, notify our office through [facility] or by phone at [phone] option 4.     Si Usted Necesita Algo Despus de Su Visita  Tambin puede enviarnos un mensaje a travs de Pharmacist, community. Por lo general respondemos a los mensajes de MyChart en el transcurso de 1 a 2 das hbiles.  Para renovar recetas, por favor pida a su farmacia que se ponga en contacto con nuestra oficina. Harland Dingwall de fax es [place] [phone].  Si tiene un asunto urgente cuando la clnica est cerrada y que no puede esperar hasta el siguiente da hbil, puede llamar/localizar a su doctor(a) al nmero que aparece a continuacin.   Por favor, tenga en cuenta que  aunque hacemos todo lo posible para estar disponibles para asuntos urgentes fuera del horario de [place], no estamos disponibles las 24 horas del da, los 7 das de la [place].   Si tiene un problema urgente y no puede comunicarse con nosotros, puede optar por buscar atencin mdica  en el consultorio de su doctor(a), en una clnica privada, en un centro de atencin urgente o en una sala de emergencias.  Si tiene Engineering geologist, por favor llame inmediatamente al 911 o vaya a la sala de emergencias.  Nmeros de bper  - Dr. Nehemiah Massed: [phone]  - Dra. Moye: [phone]  - Dra. Nicole Kindred: [phone]  En caso de inclemencias del [employer], por favor llame a Johnsie Kindred principal al [phone] para una actualizacin sobre el [place] de cualquier retraso o cierre.  Consejos para la medicacin en dermatologa: Por favor, guarde las cajas en las que vienen los medicamentos de uso tpico para ayudarle a Designer, television/film set las  instrucciones sobre dnde y cmo usarlos. Las farmacias generalmente imprimen las instrucciones del medicamento slo en las cajas y no directamente en los tubos del [place].   Si su medicamento es muy caro, por favor, pngase en contacto con Zigmund Daniel llamando al [phone] y presione la opcin 4 o envenos un mensaje a travs de Pharmacist, community.   No podemos decirle cul ser su copago por los medicamentos por adelantado ya que esto es diferente dependiendo de la cobertura de su seguro. Sin embargo, es posible que podamos encontrar un medicamento sustituto a Electrical engineer un formulario para que el seguro cubra el medicamento que se considera necesario.   Si se requiere una autorizacin previa para que su compaa de seguros [place] su medicamento, por favor permtanos de 1 a 2 das hbiles para completar este proceso.  Los precios de los medicamentos varan con frecuencia dependiendo del Environmental consultant de dnde se surte la receta y alguna farmacias pueden ofrecer precios ms  baratos.  El sitio web www.goodrx.com tiene cupones para medicamentos de Airline pilot. Los precios aqu no tienen en cuenta lo que podra costar con la ayuda del seguro (puede ser ms barato con su seguro), pero el sitio web puede darle el precio si no utiliz Research scientist (physical sciences).  - Puede imprimir el cupn correspondiente y llevarlo con su receta a la farmacia.  - Tambin puede pasar por nuestra oficina durante el horario de atencin regular y Charity fundraiser una tarjeta de cupones de GoodRx.  - Si necesita que su receta se enve electrnicamente a una farmacia diferente, informe a nuestra oficina a travs de MyChart de [place] o por telfono llamando al [phone] y presione la opcin 4.

## 2021-12-02 ENCOUNTER — Telehealth: Payer: Self-pay | Admitting: Urology

## 2021-12-02 NOTE — Telephone Encounter (Signed)
Pt was sent in 90 days w 3 refills 10/04/21. Called [facility] in regards to this, pt does have refills per the pharmacy. Pt notified.

## 2021-12-02 NOTE — Telephone Encounter (Signed)
mirabegron ER (MYRBETRIQ) 50 MG TB24 tablet   [facility] (mail order)

## 2021-12-03 ENCOUNTER — Ambulatory Visit
Admission: RE | Admit: 2021-12-03 | Discharge: 2021-12-03 | Disposition: A | Discharge status: Home / Self Care | Payer: Managed Care, Other (non HMO) | Source: Ambulatory Visit | Attending: Physician Assistant | Admitting: Physician Assistant

## 2021-12-03 DIAGNOSIS — Z1231 Encounter for screening mammogram for malignant neoplasm of breast: Secondary | ICD-10-CM | POA: Insufficient documentation

## 2021-12-04 ENCOUNTER — Ambulatory Visit: Payer: Managed Care, Other (non HMO) | Admitting: Dermatology

## 2021-12-04 DIAGNOSIS — L304 Erythema intertrigo: Secondary | ICD-10-CM

## 2021-12-04 DIAGNOSIS — L820 Inflamed seborrheic keratosis: Secondary | ICD-10-CM

## 2021-12-04 DIAGNOSIS — L821 Other seborrheic keratosis: Secondary | ICD-10-CM

## 2021-12-04 MED ORDER — HYDROCORTISONE 2.5 % EX CREA: TOPICAL_CREAM | CUTANEOUS | 2 refills | Status: DC

## 2021-12-04 MED ORDER — KETOCONAZOLE 2 % EX CREA: TOPICAL_CREAM | CUTANEOUS | 2 refills | Status: DC

## 2021-12-04 NOTE — Patient Instructions (Addendum)
Intertrigo  Mix hydrocortisone with ketaconazole 2% twice a day. If improved, decrease to hydrocortisone and ketaconazole mixed once a day. If still clear, decrease to ketaconazole only.  Once rash clear, may continue ketoconazole cream daily. May also add in pimecrolimus cream daily.   Continue Zeasorb AF powder or skin protectant (Vaseline or Desitin Original) daily as a preventative.    Cryotherapy Aftercare  Wash gently with soap and water everyday.   Apply Vaseline and Band-Aid daily until healed.   Due to recent changes in healthcare laws, you may see results of your pathology and/or laboratory studies on MyChart before the doctors have had a chance to review them. We understand that in some cases there may be results that are confusing or concerning to you. Please understand that not all results are received at the same time and often the doctors may need to interpret multiple results in order to provide you with the best plan of care or course of treatment. Therefore, we ask that you please give [place] 2 business days to thoroughly review all your results before contacting the office for clarification. Should we see a critical lab result, you will be contacted sooner.   If You Need Anything After Your Visit  If you have any questions or concerns for your doctor, please call our main line at [phone] and press option 4 to reach your doctor's medical assistant. If no one answers, please leave a voicemail as directed and we will return your call as soon as possible. Messages left after 4 pm will be answered the following business day.   You may also send [place] a message via [place]. We typically respond to MyChart messages within 1-2 business days.  For prescription refills, please ask your pharmacy to contact our office. Our fax number is [phone].  If you have an urgent issue when the clinic is closed that cannot wait until the next business day, you can page your doctor at the  number below.    Please note that while we do our best to be available for urgent issues outside of office hours, we are not available 24/7.   If you have an urgent issue and are unable to reach [place], you may choose to seek medical care at your doctor's office, retail clinic, urgent care center, or emergency room.  If you have a medical emergency, please immediately call 911 or go to the emergency department.  Pager Numbers  - Dr. Nehemiah Massed: [phone]  - Dr. Laurence Ferrari: [phone]  - Dr. Nicole Kindred: [phone]  In the event of inclement weather, please call our main line at [phone] for an update on the status of any delays or closures.  Dermatology Medication Tips: Please keep the boxes that topical medications come in in order to help keep track of the instructions about where and how to use these. Pharmacies typically print the medication instructions only on the boxes and not directly on the medication tubes.   If your medication is too expensive, please contact our office at [phone] option 4 or send [place] a message through [place].   We are unable to tell what your co-pay for medications will be in advance as this is different depending on your insurance coverage. However, we may be able to find a substitute medication at lower cost or fill out paperwork to get insurance to cover a needed medication.   If a prior authorization is required to get your medication covered by your insurance company, please allow [place] 1-2 business  days to complete this process.  Drug prices often vary depending on where the prescription is filled and some pharmacies may offer cheaper prices.  The website www.goodrx.com contains coupons for medications through different pharmacies. The prices here do not account for what the cost may be with help from insurance (it may be cheaper with your insurance), but the website can give you the price if you did not use any insurance.  - You can print the associated  coupon and take it with your prescription to the pharmacy.  - You may also stop by our office during regular business hours and pick up a GoodRx coupon card.  - If you need your prescription sent electronically to a different pharmacy, notify our office through [facility] or by phone at [phone] option 4.     Si Usted Necesita Algo Despus de Su Visita  Tambin puede enviarnos un mensaje a travs de Pharmacist, community. Por lo general respondemos a los mensajes de MyChart en el transcurso de 1 a 2 das hbiles.  Para renovar recetas, por favor pida a su farmacia que se ponga en contacto con nuestra oficina. Harland Dingwall de fax es [place] [phone].  Si tiene un asunto urgente cuando la clnica est cerrada y que no puede esperar hasta el siguiente da hbil, puede llamar/localizar a su doctor(a) al nmero que aparece a continuacin.   Por favor, tenga en cuenta que aunque hacemos todo lo posible para estar disponibles para asuntos urgentes fuera del horario de [place], no estamos disponibles las 24 horas del da, los 7 das de la [place].   Si tiene un problema urgente y no puede comunicarse con nosotros, puede optar por buscar atencin mdica  en el consultorio de su doctor(a), en una clnica privada, en un centro de atencin urgente o en una sala de emergencias.  Si tiene Engineering geologist, por favor llame inmediatamente al 911 o vaya a la sala de emergencias.  Nmeros de bper  - Dr. Nehemiah Massed: [phone]  - Dra. Moye: [phone]  - Dra. Nicole Kindred: [phone]  En caso de inclemencias del [place], por favor llame a Johnsie Kindred principal al [phone] para una actualizacin sobre el [place] de cualquier retraso o cierre.  Consejos para la medicacin en dermatologa: Por favor, guarde las cajas en las que vienen los medicamentos de uso tpico para ayudarle a seguir las instrucciones sobre dnde y cmo usarlos. Las farmacias generalmente imprimen las instrucciones del  medicamento slo en las cajas y no directamente en los tubos del [place].   Si su medicamento es muy caro, por favor, pngase en contacto con Zigmund Daniel llamando al [phone] y presione la opcin 4 o envenos un mensaje a travs de Pharmacist, community.   No podemos decirle cul ser su copago por los medicamentos por adelantado ya que esto es diferente dependiendo de la cobertura de su seguro. Sin embargo, es posible que podamos encontrar un medicamento sustituto a Electrical engineer un formulario para que el seguro cubra el medicamento que se considera necesario.   Si se requiere una autorizacin previa para que su compaa de seguros [place] su medicamento, por favor permtanos de 1 a 2 das hbiles para completar este proceso.  Los precios de los medicamentos varan con frecuencia dependiendo del Environmental consultant de dnde se surte la receta y alguna farmacias pueden ofrecer precios ms baratos.  El sitio web www.goodrx.com tiene cupones para medicamentos de Airline pilot. Los precios aqu no tienen en cuenta lo que podra costar con la  ayuda del seguro (puede ser ms barato con su seguro), pero el sitio web puede darle el precio si no Field seismologist.  - Puede imprimir el cupn correspondiente y llevarlo con su receta a la farmacia.  - Tambin puede pasar por nuestra oficina durante el horario de atencin regular y Charity fundraiser una tarjeta de cupones de GoodRx.  - Si necesita que su receta se enve electrnicamente a una farmacia diferente, informe a nuestra oficina a travs de MyChart de [place] o por telfono llamando al [phone] y presione la opcin 4.

## 2021-12-04 NOTE — Progress Notes (Signed)
   Follow-Up Visit   Subjective  TAMERA PINGLEY is a 56 y.o. female who presents for the following: Intertrigo (Gluteal crease, some improvement with pimecrolimus cream, but wonders if there is something stronger she can use. ), Inflamed Seborrheic Keratosis (R inf areola, small residual patient would like treated, itchy.), and Spots on face (3 spots on face noticed recently, asymptomatic).   The following portions of the chart were reviewed this encounter and updated as appropriate:       Review of Systems:  No other skin or systemic complaints except as noted in HPI or Assessment and Plan.  Objective  Well appearing patient in no apparent distress; mood and affect are within normal limits.  A focused examination was performed including face, breast, gluteal crease. Relevant physical exam findings are noted in the Assessment and Plan.  Right Breast Residual waxy tan patch, raised at lateral/inf. edge  Gluteal Crease Medial buttocks at gluteal cleft erythema with small pink papules    Assessment & Plan  Seborrheic Keratoses - Stuck-on, waxy, tan-brown papules and/or plaques including left inf jaw and right lower cheek - Benign-appearing - Discussed benign etiology and prognosis. - Observe - Call for any changes Inflamed seborrheic keratosis Right Breast  Residual and symptomatic, irritating, patient would like treated. 2nd treatment today.   Destruction of lesion - Right Breast  Destruction method: cryotherapy   Informed consent: discussed and consent obtained   Lesion destroyed using liquid nitrogen: Yes   Region frozen until ice ball extended beyond lesion: Yes   Outcome: patient tolerated procedure well with no complications   Post-procedure details: wound care instructions given   Additional details:  Prior to procedure, discussed risks of blister formation, small wound, skin dyspigmentation, or rare scar following cryotherapy. Recommend Vaseline ointment to treated  areas while healing.   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 Condition is bothersome/symptomatic for patient. Currently flared.   Intertrigo is a chronic recurrent rash that occurs in skin fold areas that may be associated with friction; heat; moisture; yeast; fungus; and bacteria.  It is exacerbated by increased movement / activity; sweating; and higher atmospheric temperature.  Start ketoconazole 2% cream Apply to AA QD/BID as directed Start hydrocortisone 2.5% cream Apply to AA QD/BID as directed Continue Zeasorb AF powder daily.  Once improved, may restart Elidel cream prn   ketoconazole (NIZORAL) 2 % cream - Gluteal Crease Apply to affected area rash once to twice a day as directed.  hydrocortisone 2.5 % cream - Gluteal Crease Apply to affected area rash once to twice daily as directed.   Return as scheduled, for TBSE.  IJamesetta Orleans, CMA, am acting as scribe for Brendolyn Patty, MD .  Documentation: I have reviewed the above documentation for accuracy and completeness, and I agree with the above.  Brendolyn Patty MD

## 2021-12-12 ENCOUNTER — Telehealth: Payer: Self-pay

## 2021-12-12 MED ORDER — FLUCONAZOLE 200 MG PO TABS: 200.0000 mg | ORAL_TABLET | ORAL | 0 refills | Status: DC

## 2021-12-12 NOTE — Telephone Encounter (Signed)
Patient advised of information per Dr. Nehemiah Massed. She has taken Fluconazole before with no issues or side effects. RX sent in. aw

## 2021-12-12 NOTE — Telephone Encounter (Signed)
Patient called regarding intertrigo that is currently being treated by Dr. Nicole Kindred. Patient called in this AM to let us know still no improvement and Dr. Nicole Kindred discussed oral treatment last appointment. I don't see anything noted and advised patient I would ask.    Patient advised Dr. Nicole Kindred out of the office until Monday but I would reach out to Dr. Nehemiah Massed as well.

## 2022-01-22 ENCOUNTER — Ambulatory Visit: Payer: Managed Care, Other (non HMO) | Admitting: Dermatology

## 2022-01-22 DIAGNOSIS — L738 Other specified follicular disorders: Secondary | ICD-10-CM

## 2022-01-22 DIAGNOSIS — L820 Inflamed seborrheic keratosis: Secondary | ICD-10-CM

## 2022-01-22 DIAGNOSIS — L821 Other seborrheic keratosis: Secondary | ICD-10-CM

## 2022-01-22 NOTE — Patient Instructions (Addendum)
Cryotherapy Aftercare  Wash gently with soap and water everyday.   Apply Vaseline and Band-Aid daily until healed.    Recommend daily broad spectrum sunscreen SPF 30+ to sun-exposed areas, reapply every 2 hours as needed. Call for new or changing lesions.  Staying in the shade or wearing long sleeves, sun glasses (UVA+UVB protection) and wide brim hats (4-inch brim around the entire circumference of the hat) are also recommended for sun protection.    Due to recent changes in healthcare laws, you may see results of your pathology and/or laboratory studies on MyChart before the doctors have had a chance to review them. We understand that in some cases there may be results that are confusing or concerning to you. Please understand that not all results are received at the same time and often the doctors may need to interpret multiple results in order to provide you with the best plan of care or course of treatment. Therefore, we ask that you please give us 2 business days to thoroughly review all your results before contacting the office for clarification. Should we see a critical lab result, you will be contacted sooner.   If You Need Anything After Your Visit  If you have any questions or concerns for your doctor, please call our main line at [phone] and press option 4 to reach your doctor's medical assistant. If no one answers, please leave a voicemail as directed and we will return your call as soon as possible. Messages left after 4 pm will be answered the following business day.   You may also send us a message via MyChart. We typically respond to MyChart messages within 1-2 business days.  For prescription refills, please ask your pharmacy to contact our office. Our fax number is [phone].  If you have an urgent issue when the clinic is closed that cannot wait until the next business day, you can page your doctor at the number below.    Please note that while we do our best to be  available for urgent issues outside of office hours, we are not available 24/7.   If you have an urgent issue and are unable to reach us, you may choose to seek medical care at your doctor's office, retail clinic, urgent care center, or emergency room.  If you have a medical emergency, please immediately call 911 or go to the emergency department.  Pager Numbers  - Dr. Kowalski: [phone]  - Dr. Moye: [phone]  - Dr. Stewart: [phone]  In the event of inclement weather, please call our main line at [phone] for an update on the status of any delays or closures.  Dermatology Medication Tips: Please keep the boxes that topical medications come in in order to help keep track of the instructions about where and how to use these. Pharmacies typically print the medication instructions only on the boxes and not directly on the medication tubes.   If your medication is too expensive, please contact our office at [phone] option 4 or send us a message through MyChart.   We are unable to tell what your co-pay for medications will be in advance as this is different depending on your insurance coverage. However, we may be able to find a substitute medication at lower cost or fill out paperwork to get insurance to cover a needed medication.   If a prior authorization is required to get your medication covered by your insurance company, please allow us 1-2 business days to complete this process.    Drug prices often vary depending on where the prescription is filled and some pharmacies may offer cheaper prices.  The website www.goodrx.com contains coupons for medications through different pharmacies. The prices here do not account for what the cost may be with help from insurance (it may be cheaper with your insurance), but the website can give you the price if you did not use any insurance.  - You can print the associated coupon and take it with your prescription to the  pharmacy.  - You may also stop by our office during regular business hours and pick up a GoodRx coupon card.  - If you need your prescription sent electronically to a different pharmacy, notify our office through [place] MyChart or by phone at [phone] option 4.     Si Usted Necesita Algo Despus de Su Visita  Tambin puede enviarnos un mensaje a travs de MyChart. Por lo general respondemos a los mensajes de MyChart en el transcurso de 1 a 2 das hbiles.  Para renovar recetas, por favor pida a su farmacia que se ponga en contacto con nuestra oficina. Nuestro nmero de fax es el [phone].  Si tiene un asunto urgente cuando la clnica est cerrada y que no puede esperar hasta el siguiente da hbil, puede llamar/localizar a su doctor(a) al nmero que aparece a continuacin.   Por favor, tenga en cuenta que aunque hacemos todo lo posible para estar disponibles para asuntos urgentes fuera del horario de oficina, no estamos disponibles las 24 horas del da, los 7 das de la semana.   Si tiene un problema urgente y no puede comunicarse con nosotros, puede optar por buscar atencin mdica  en el consultorio de su doctor(a), en una clnica privada, en un centro de atencin urgente o en una sala de emergencias.  Si tiene una emergencia mdica, por favor llame inmediatamente al 911 o vaya a la sala de emergencias.  Nmeros de bper  - Dr. Kowalski: [phone]  - Dra. Moye: [phone]  - Dra. Stewart: [phone]  En caso de inclemencias del tiempo, por favor llame a nuestra lnea principal al [phone] para una actualizacin sobre el estado de cualquier retraso o cierre.  Consejos para la medicacin en dermatologa: Por favor, guarde las cajas en las que vienen los medicamentos de uso tpico para ayudarle a seguir las instrucciones sobre dnde y cmo usarlos. Las farmacias generalmente imprimen las instrucciones del medicamento slo en las cajas y no directamente en los  tubos del medicamento.   Si su medicamento es muy caro, por favor, pngase en contacto con nuestra oficina llamando al [phone] y presione la opcin 4 o envenos un mensaje a travs de MyChart.   No podemos decirle cul ser su copago por los medicamentos por adelantado ya que esto es diferente dependiendo de la cobertura de su seguro. Sin embargo, es posible que podamos encontrar un medicamento sustituto a menor costo o llenar un formulario para que el seguro cubra el medicamento que se considera necesario.   Si se requiere una autorizacin previa para que su compaa de seguros cubra su medicamento, por favor permtanos de 1 a 2 das hbiles para completar este proceso.  Los precios de los medicamentos varan con frecuencia dependiendo del lugar de dnde se surte la receta y alguna farmacias pueden ofrecer precios ms baratos.  El sitio web www.goodrx.com tiene cupones para medicamentos de diferentes farmacias. Los precios aqu no tienen en cuenta lo que podra costar con la ayuda del seguro (puede ser ms   barato con su seguro), pero el sitio web puede darle el precio si no utiliz ningn seguro.  - Puede imprimir el cupn correspondiente y llevarlo con su receta a la farmacia.  - Tambin puede pasar por nuestra oficina durante el horario de atencin regular y recoger una tarjeta de cupones de GoodRx.  - Si necesita que su receta se enve electrnicamente a una farmacia diferente, informe a nuestra oficina a travs de MyChart de [place] o por telfono llamando al [phone] y presione la opcin 4.  

## 2022-01-22 NOTE — Progress Notes (Signed)
Follow-Up Visit   Subjective  Lisa Barry is a 56 y.o. female who presents for the following: Follow-up (Hx of ISK treated on right breast. Still having itching at area, would like rechecked. Patient reports she has had large oil glands on forehead treated and they have recurred).  The patient has spots, moles and lesions to be evaluated, some may be new or changing and the patient has concerns that these could be cancer.   The following portions of the chart were reviewed this encounter and updated as appropriate:      Review of Systems: No other skin or systemic complaints except as noted in HPI or Assessment and Plan.   Objective  Well appearing patient in no apparent distress; mood and affect are within normal limits.  A focused examination was performed including face, chest. Relevant physical exam findings are noted in the Assessment and Plan.  glabella (3) Small yellow papules with a central dell. Face including glabella and medial cheeks      right perioral Stuck-on, waxy, tan-brown papule or plaque --Discussed benign etiology and prognosis.      Right Breast Hypopigmented patch with surrounding hyperpigmentation (recurring ISK vrs PIH), not raised or scaly   Assessment & Plan  Sebaceous hyperplasia glabella  Discussed cosmetic procedure (electrodesiccation), noncovered.  $60 for 1st lesion and $15 for each additional lesion if done on the same day.  Maximum charge $350.  One touch-up treatment included no charge. Discussed risks of treatment including dyspigmentation, small scar, and/or recurrence. Recommend daily broad spectrum sunscreen SPF 30+/photoprotection to treated areas once healed.   3 lesions treated today- $90  Destruction of lesion - glabella  Destruction method: electrodesiccation and curettage   Destruction method comment:  Electrodesiccation only, not curretage Informed consent: discussed and consent obtained   Timeout:  patient name,  date of birth, surgical site, and procedure verified Anesthesia: the lesion was anesthetized in a standard fashion   Hemostasis achieved with:  electrodesiccation Outcome: patient tolerated procedure well with no complications   Post-procedure details: wound care instructions given   Additional details:  3 lesions treated today at glabella  Seborrheic keratosis right perioral  Reassured benign age-related growth.  Recommend observation.  Discussed cryotherapy if spot(s) become irritated or inflamed.  Asymptomatic, but pt would like it removed. Discussed cosmetic procedure (cryotherapy), noncovered.  $60 for 1st lesion and $15 for each additional lesion if done on the same day.  Maximum charge $350.  One touch-up treatment included no charge. Discussed risks of treatment including dyspigmentation, small scar, and/or recurrence. Recommend daily broad spectrum sunscreen SPF 30+/photoprotection to treated areas once healed.   1 lesion treated- additional $15 added to total cosmetic destructions  Destruction of lesion - right perioral  Destruction method: cryotherapy   Informed consent: discussed and consent obtained   Lesion destroyed using liquid nitrogen: Yes   Region frozen until ice ball extended beyond lesion: Yes   Outcome: patient tolerated procedure well with no complications   Post-procedure details: wound care instructions given   Additional details:  Prior to procedure, discussed risks of blister formation, small wound, skin dyspigmentation, or rare scar following cryotherapy. Recommend Vaseline ointment to treated areas while healing.   Inflamed seborrheic keratosis Right Breast  Treated twice with cryotherapy.  Possible recurrence ISK vs itching from eczema  Use Pimecrolimus twice daily as needed for itching. Do not recommend additional cryotherapy at this time.   Return if symptoms worsen or fail to improve.  Caren Macadam  Parcell, CMA, am acting as scribe for Brendolyn Patty,  MD.  Documentation: I have reviewed the above documentation for accuracy and completeness, and I agree with the above.  Brendolyn Patty MD

## 2022-03-10 ENCOUNTER — Encounter (INDEPENDENT_AMBULATORY_CARE_PROVIDER_SITE_OTHER): Payer: Self-pay

## 2022-03-24 ENCOUNTER — Ambulatory Visit: Payer: Managed Care, Other (non HMO) | Admitting: Dermatology

## 2022-03-24 DIAGNOSIS — L219 Seborrheic dermatitis, unspecified: Secondary | ICD-10-CM | POA: Diagnosis not present

## 2022-03-24 DIAGNOSIS — L820 Inflamed seborrheic keratosis: Secondary | ICD-10-CM

## 2022-03-24 MED ORDER — KETOCONAZOLE 2 % EX SHAM: 1.0000 | MEDICATED_SHAMPOO | CUTANEOUS | 6 refills | Status: DC

## 2022-03-24 NOTE — Patient Instructions (Addendum)
Cryotherapy Aftercare  Wash gently with soap and water everyday.   Apply Vaseline and Band-Aid daily until healed.     Due to recent changes in healthcare laws, you may see results of your pathology and/or laboratory studies on MyChart before the doctors have had a chance to review them. We understand that in some cases there may be results that are confusing or concerning to you. Please understand that not all results are received at the same time and often the doctors may need to interpret multiple results in order to provide you with the best plan of care or course of treatment. Therefore, we ask that you please give us 2 business days to thoroughly review all your results before contacting the office for clarification. Should we see a critical lab result, you will be contacted sooner.   If You Need Anything After Your Visit  If you have any questions or concerns for your doctor, please call our main line at [phone] and press option 4 to reach your doctor's medical assistant. If no one answers, please leave a voicemail as directed and we will return your call as soon as possible. Messages left after 4 pm will be answered the following business day.   You may also send us a message via MyChart. We typically respond to MyChart messages within 1-2 business days.  For prescription refills, please ask your pharmacy to contact our office. Our fax number is [phone].  If you have an urgent issue when the clinic is closed that cannot wait until the next business day, you can page your doctor at the number below.    Please note that while we do our best to be available for urgent issues outside of office hours, we are not available 24/7.   If you have an urgent issue and are unable to reach us, you may choose to seek medical care at your doctor's office, retail clinic, urgent care center, or emergency room.  If you have a medical emergency, please immediately call 911 or go to the  emergency department.  Pager Numbers  - Dr. Kowalski: [phone]  - Dr. Moye: [phone]  - Dr. Stewart: [phone]  In the event of inclement weather, please call our main line at [phone] for an update on the status of any delays or closures.  Dermatology Medication Tips: Please keep the boxes that topical medications come in in order to help keep track of the instructions about where and how to use these. Pharmacies typically print the medication instructions only on the boxes and not directly on the medication tubes.   If your medication is too expensive, please contact our office at [phone] option 4 or send us a message through MyChart.   We are unable to tell what your co-pay for medications will be in advance as this is different depending on your insurance coverage. However, we may be able to find a substitute medication at lower cost or fill out paperwork to get insurance to cover a needed medication.   If a prior authorization is required to get your medication covered by your insurance company, please allow us 1-2 business days to complete this process.  Drug prices often vary depending on where the prescription is filled and some pharmacies may offer cheaper prices.  The website www.goodrx.com contains coupons for medications through different pharmacies. The prices here do not account for what the cost may be with help from insurance (it may be cheaper with your insurance), but the website can   give you the price if you did not use any insurance.  - You can print the associated coupon and take it with your prescription to the pharmacy.  - You may also stop by our office during regular business hours and pick up a GoodRx coupon card.  - If you need your prescription sent electronically to a different pharmacy, notify our office through [facility] MyChart or by phone at [phone] option 4.     Si Usted Necesita Algo Despus de Su Visita  Tambin puede  enviarnos un mensaje a travs de MyChart. Por lo general respondemos a los mensajes de MyChart en el transcurso de 1 a 2 das hbiles.  Para renovar recetas, por favor pida a su farmacia que se ponga en contacto con nuestra oficina. Nuestro nmero de fax es el [phone].  Si tiene un asunto urgente cuando la clnica est cerrada y que no puede esperar hasta el siguiente da hbil, puede llamar/localizar a su doctor(a) al nmero que aparece a continuacin.   Por favor, tenga en cuenta que aunque hacemos todo lo posible para estar disponibles para asuntos urgentes fuera del horario de oficina, no estamos disponibles las 24 horas del da, los 7 das de la semana.   Si tiene un problema urgente y no puede comunicarse con nosotros, puede optar por buscar atencin mdica  en el consultorio de su doctor(a), en una clnica privada, en un centro de atencin urgente o en una sala de emergencias.  Si tiene una emergencia mdica, por favor llame inmediatamente al 911 o vaya a la sala de emergencias.  Nmeros de bper  - Dr. Kowalski: [phone]  - Dra. Moye: [phone]  - Dra. Stewart: [phone]  En caso de inclemencias del tiempo, por favor llame a nuestra lnea principal al [phone] para una actualizacin sobre el estado de cualquier retraso o cierre.  Consejos para la medicacin en dermatologa: Por favor, guarde las cajas en las que vienen los medicamentos de uso tpico para ayudarle a seguir las instrucciones sobre dnde y cmo usarlos. Las farmacias generalmente imprimen las instrucciones del medicamento slo en las cajas y no directamente en los tubos del medicamento.   Si su medicamento es muy caro, por favor, pngase en contacto con nuestra oficina llamando al [phone] y presione la opcin 4 o envenos un mensaje a travs de MyChart.   No podemos decirle cul ser su copago por los medicamentos por adelantado ya que esto es diferente dependiendo de la cobertura de su seguro.  Sin embargo, es posible que podamos encontrar un medicamento sustituto a menor costo o llenar un formulario para que el seguro cubra el medicamento que se considera necesario.   Si se requiere una autorizacin previa para que su compaa de seguros cubra su medicamento, por favor permtanos de 1 a 2 das hbiles para completar este proceso.  Los precios de los medicamentos varan con frecuencia dependiendo del lugar de dnde se surte la receta y alguna farmacias pueden ofrecer precios ms baratos.  El sitio web www.goodrx.com tiene cupones para medicamentos de diferentes farmacias. Los precios aqu no tienen en cuenta lo que podra costar con la ayuda del seguro (puede ser ms barato con su seguro), pero el sitio web puede darle el precio si no utiliz ningn seguro.  - Puede imprimir el cupn correspondiente y llevarlo con su receta a la farmacia.  - Tambin puede pasar por nuestra oficina durante el horario de atencin regular y recoger una tarjeta de cupones de GoodRx.  -   Si necesita que su receta se enve electrnicamente a una farmacia diferente, informe a nuestra oficina a travs de MyChart de [place] o por telfono llamando al [phone] y presione la opcin 4.  

## 2022-03-24 NOTE — Progress Notes (Signed)
   Follow-Up Visit   Subjective  Lisa Barry is a 56 y.o. female who presents for the following: ISK (R breast, txted in past with LN2 x 2 times, using HC 2.5% cr) and Seborrheic Dermatitis (Scalp, pt needs refill of Ketoconazole 2% shampoo). Shampoo helps a lot.  Spot on breast is very itchy and topical HC cream is not helping it.   The following portions of the chart were reviewed this encounter and updated as appropriate:       Review of Systems:  No other skin or systemic complaints except as noted in HPI or Assessment and Plan.  Objective  Well appearing patient in no apparent distress; mood and affect are within normal limits.  A focused examination was performed including breast. Relevant physical exam findings are noted in the Assessment and Plan.  Scalp Mild scaling  Right Breast Recurrent stuck on waxy pap with erythema    Assessment & Plan  Seborrheic dermatitis Scalp  Chronic condition with duration or expected duration over one year. Currently well-controlled.   Seborrheic Dermatitis  -  is a chronic persistent rash characterized by pinkness and scaling most commonly of the mid face but also can occur on the scalp (dandruff), ears; mid chest, mid back and groin.  It tends to be exacerbated by stress and cooler weather.  People who have neurologic disease may experience new onset or exacerbation of existing seborrheic dermatitis.  The condition is not curable but treatable and can be controlled.  Cont Ketoconazole 2% shampoo  2-3x/wk, let sit 5 minutes and rinse out  ketoconazole (NIZORAL) 2 % shampoo - Scalp Apply 1 Application topically as directed. Wash scalp 2-3 times weekly, let sit 5 minutes before rinsing out  Related Medications mometasone (ELOCON) 0.1 % lotion Apply qd-bid to affected areas on scalp  Inflamed seborrheic keratosis Right Breast  Symptomatic, irritating, patient would like treated.   Destruction of lesion - Right  Breast  Destruction method: cryotherapy   Informed consent: discussed and consent obtained   Lesion destroyed using liquid nitrogen: Yes   Region frozen until ice ball extended beyond lesion: Yes   Outcome: patient tolerated procedure well with no complications   Post-procedure details: wound care instructions given   Additional details:  Prior to procedure, discussed risks of blister formation, small wound, skin dyspigmentation, or rare scar following cryotherapy. Recommend Vaseline ointment to treated areas while healing.    Return for as scheduled for TBSE.  I, Othelia Pulling, RMA, am acting as scribe for Brendolyn Patty, MD .  Documentation: I have reviewed the above documentation for accuracy and completeness, and I agree with the above.  Brendolyn Patty MD

## 2022-06-17 ENCOUNTER — Ambulatory Visit: Payer: Managed Care, Other (non HMO) | Admitting: Dermatology

## 2022-06-17 VITALS — BP 111/63 | HR 84

## 2022-06-17 DIAGNOSIS — D2261 Melanocytic nevi of right upper limb, including shoulder: Secondary | ICD-10-CM

## 2022-06-17 DIAGNOSIS — D229 Melanocytic nevi, unspecified: Secondary | ICD-10-CM

## 2022-06-17 DIAGNOSIS — L309 Dermatitis, unspecified: Secondary | ICD-10-CM

## 2022-06-17 DIAGNOSIS — L578 Other skin changes due to chronic exposure to nonionizing radiation: Secondary | ICD-10-CM

## 2022-06-17 DIAGNOSIS — Z1283 Encounter for screening for malignant neoplasm of skin: Secondary | ICD-10-CM

## 2022-06-17 DIAGNOSIS — L738 Other specified follicular disorders: Secondary | ICD-10-CM

## 2022-06-17 DIAGNOSIS — L814 Other melanin hyperpigmentation: Secondary | ICD-10-CM

## 2022-06-17 DIAGNOSIS — L821 Other seborrheic keratosis: Secondary | ICD-10-CM

## 2022-06-17 MED ORDER — OPZELURA 1.5 % EX CREA: TOPICAL_CREAM | CUTANEOUS | 0 refills | Status: DC

## 2022-06-17 NOTE — Patient Instructions (Addendum)
Opzelura Cream - Apply to rash on right breast and upper buttocks area twice daily as needed for itch.  Recommend daily broad spectrum sunscreen SPF 30+ to sun-exposed areas, reapply every 2 hours as needed. Call for new or changing lesions.  Staying in the shade or wearing long sleeves, sun glasses (UVA+UVB protection) and wide brim hats (4-inch brim around the entire circumference of the hat) are also recommended for sun protection.   Melanoma ABCDEs  Melanoma is the most dangerous type of skin cancer, and is the leading cause of death from skin disease.  You are more likely to develop melanoma if you: Have light-colored skin, light-colored eyes, or red or blond hair Spend a lot of time in the sun Tan regularly, either outdoors or in a tanning bed Have had blistering sunburns, especially during childhood Have a close family member who has had a melanoma Have atypical moles or large birthmarks  Early detection of melanoma is key since treatment is typically straightforward and cure rates are extremely high if we catch it early.   The first sign of melanoma is often a change in a mole or a new dark spot.  The ABCDE system is a way of remembering the signs of melanoma.  A for asymmetry:  The two halves do not match. B for border:  The edges of the growth are irregular. C for color:  A mixture of colors are present instead of an even brown color. D for diameter:  Melanomas are usually (but not always) greater than 58m - the size of a pencil eraser. E for evolution:  The spot keeps changing in size, shape, and color.  Please check your skin once per month between visits. You can use a small mirror in front and a large mirror behind you to keep an eye on the back side or your body.   If you see any new or changing lesions before your next follow-up, please call to schedule a visit.  Please continue daily skin protection including broad spectrum sunscreen SPF 30+ to sun-exposed areas,  reapplying every 2 hours as needed when you're outdoors.   Staying in the shade or wearing long sleeves, sun glasses (UVA+UVB protection) and wide brim hats (4-inch brim around the entire circumference of the hat) are also recommended for sun protection.     Due to recent changes in healthcare laws, you may see results of your pathology and/or laboratory studies on MyChart before the doctors have had a chance to review them. We understand that in some cases there may be results that are confusing or concerning to you. Please understand that not all results are received at the same time and often the doctors may need to interpret multiple results in order to provide you with the best plan of care or course of treatment. Therefore, we ask that you please give u[place]2 business days to thoroughly review all your results before contacting the office for clarification. Should we see a critical lab result, you will be contacted sooner.   If You Need Anything After Your Visit  If you have any questions or concerns for your doctor, please call our main line at [phone] press option 4 to reach your doctor's medical assistant. If no one answers, please leave a voicemail as directed and we will return your call as soon as possible. Messages left after 4 pm will be answered the following business day.   You may also send [place] message via [facility]  via MyChart. We typically respond to MyChart messages within 1-2 business days.  For prescription refills, please ask your pharmacy to contact our office. Our fax number is [phone].  If you have an urgent issue when the clinic is closed that cannot wait until the next business day, you can page your doctor at the number below.    Please note that while we do our best to be available for urgent issues outside of office hours, we are not available 24/7.   If you have an urgent issue and are unable to reach us, you may choose to seek medical care at your doctor's  office, retail clinic, urgent care center, or emergency room.  If you have a medical emergency, please immediately call 911 or go to the emergency department.  Pager Numbers  - Dr. Kowalski: [phone]  - Dr. Moye: [phone]  - Dr. Stewart: [phone]  In the event of inclement weather, please call our main line at [phone] for an update on the status of any delays or closures.  Dermatology Medication Tips: Please keep the boxes that topical medications come in in order to help keep track of the instructions about where and how to use these. Pharmacies typically print the medication instructions only on the boxes and not directly on the medication tubes.   If your medication is too expensive, please contact our office at [phone] option 4 or send us a message through MyChart.   We are unable to tell what your co-pay for medications will be in advance as this is different depending on your insurance coverage. However, we may be able to find a substitute medication at lower cost or fill out paperwork to get insurance to cover a needed medication.   If a prior authorization is required to get your medication covered by your insurance company, please allow us 1-2 business days to complete this process.  Drug prices often vary depending on where the prescription is filled and some pharmacies may offer cheaper prices.  The website www.goodrx.com contains coupons for medications through different pharmacies. The prices here do not account for what the cost may be with help from insurance (it may be cheaper with your insurance), but the website can give you the price if you did not use any insurance.  - You can print the associated coupon and take it with your prescription to the pharmacy.  - You may also stop by our office during regular business hours and pick up a GoodRx coupon card.  - If you need your prescription sent electronically to a different pharmacy, notify our office  through [place] MyChart or by phone at [phone] option 4.     Si Usted Necesita Algo Despus de Su Visita  Tambin puede enviarnos un mensaje a travs de MyChart. Por lo general respondemos a los mensajes de MyChart en el transcurso de 1 a 2 das hbiles.  Para renovar recetas, por favor pida a su farmacia que se ponga en contacto con nuestra oficina. Nuestro nmero de fax es el [phone].  Si tiene un asunto urgente cuando la clnica est cerrada y que no puede esperar hasta el siguiente da hbil, puede llamar/localizar a su doctor(a) al nmero que aparece a continuacin.   Por favor, tenga en cuenta que aunque hacemos todo lo posible para estar disponibles para asuntos urgentes fuera del horario de oficina, no estamos disponibles las 24 horas del da, los 7 das de la semana.   Si tiene un problema urgente y   con nosotros, puede optar por buscar atencin mdica  en el consultorio de su doctor(a), en una clnica privada, en un centro de atencin urgente o en una sala de emergencias.  Si tiene Engineering geologist, por favor llame inmediatamente al 911 o vaya a la sala de emergencias.  Nmeros de bper  - Dr. Nehemiah Massed: [phone]  - Dra. Moye: [phone]  - Dra. Nicole Kindred: [phone]  En caso de inclemencias del [place], por favor llame a Johnsie Kindred principal al [phone] para una actualizacin sobre el [place] de cualquier retraso o cierre.  Consejos para la medicacin en dermatologa: Por favor, guarde las cajas en las que vienen los medicamentos de uso tpico para ayudarle a seguir las instrucciones sobre dnde y cmo usarlos. Las farmacias generalmente imprimen las instrucciones del medicamento slo en las cajas y no directamente en los tubos del [place].   Si su medicamento es muy caro, por favor, pngase en contacto con Zigmund Daniel llamando al [phone] y presione la opcin 4 o envenos un mensaje a travs de Pharmacist, community.   No podemos  decirle cul ser su copago por los medicamentos por adelantado ya que esto es diferente dependiendo de la cobertura de su seguro. Sin embargo, es posible que podamos encontrar un medicamento sustituto a Electrical engineer un formulario para que el seguro cubra el medicamento que se considera necesario.   Si se requiere una autorizacin previa para que su compaa de seguros [place] su medicamento, por favor permtanos de 1 a 2 das hbiles para completar este proceso.  Los precios de los medicamentos varan con frecuencia dependiendo del Environmental consultant de dnde se surte la receta y alguna farmacias pueden ofrecer precios ms baratos.  El sitio web www.goodrx.com tiene cupones para medicamentos de Airline pilot. Los precios aqu no tienen en cuenta lo que podra costar con la ayuda del seguro (puede ser ms barato con su seguro), pero el sitio web puede darle el precio si no utiliz Research scientist (physical sciences).  - Puede imprimir el cupn correspondiente y llevarlo con su receta a la farmacia.  - Tambin puede pasar por nuestra oficina durante el horario de atencin regular y Charity fundraiser una tarjeta de cupones de GoodRx.  - Si necesita que su receta se enve electrnicamente a una farmacia diferente, informe a nuestra oficina a travs de MyChart de [place] o por telfono llamando al [phone] y presione la opcin 4.

## 2022-06-17 NOTE — Progress Notes (Signed)
Follow-Up Visit   Subjective  Lisa Barry is a 57 y.o. female who presents for the following: Annual Exam.  The patient presents for Total-Body Skin Exam (TBSE) for skin cancer screening and mole check.  The patient has spots, moles and lesions to be evaluated, some may be new or changing. She has a residual inflamed SK of the right areola that has been treated 3 times with cryotherapy. She still has intense itching at times. She is using hydrocortisone 2.5% cream as needed.   The following portions of the chart were reviewed this encounter and updated as appropriate:       Review of Systems:  No other skin or systemic complaints except as noted in HPI or Assessment and Plan.  Objective  Well appearing patient in no apparent distress; mood and affect are within normal limits.  A full examination was performed including scalp, head, eyes, ears, nose, lips, neck, chest, axillae, abdomen, back, buttocks, bilateral upper extremities, bilateral lower extremities, hands, feet, fingers, toes, fingernails, and toenails. All findings within normal limits unless otherwise noted below.  Right Breast Mild erythema and scale of the right inferior areola with inf pink white scar- no obvious residual ISK; pink scaly patch of the superior gluteal cleft.   right upper arm 4.0 x 3.0 mm med dark brown macule - no changes when compared to photo 06/12/2021    Assessment & Plan  Skin cancer screening performed today.  Actinic Damage - chronic, secondary to cumulative UV radiation exposure/sun exposure over time - diffuse scaly erythematous macules with underlying dyspigmentation - Recommend daily broad spectrum sunscreen SPF 30+ to sun-exposed areas, reapply every 2 hours as needed.  - Recommend staying in the shade or wearing long sleeves, sun glasses (UVA+UVB protection) and wide brim hats (4-inch brim around the entire circumference of the hat). - Call for new or changing lesions.  Lentigines -  Scattered tan macules - Due to sun exposure - Benign-appearing, observe - Recommend daily broad spectrum sunscreen SPF 30+ to sun-exposed areas, reapply every 2 hours as needed. - Call for any changes  Seborrheic Keratoses - Stuck-on, waxy, tan-brown papules and/or plaques  - Benign-appearing - Discussed benign etiology and prognosis. - Observe - Call for any changes  Melanocytic Nevi - Tan-brown and/or pink-flesh-colored symmetric macules and papules - Benign appearing on exam today - Observation - Call clinic for new or changing moles - Recommend daily use of broad spectrum spf 30+ sunscreen to sun-exposed areas.   Sebaceous Hyperplasia - Small yellow papules with a central dell - Benign - Observe  Dermatitis Right Breast  Chronic and persistent condition with duration or expected duration over one year. Condition is symptomatic / bothersome to patient. Not to goal, currently flared. No improvement with hydrocortisone 2.5% cream.   Start Opzelura Cream apply to AA breast and gluteal cleft BID prn itch dsp 60g 0Rf.  Sample given Lot [id number] Exp 02/2023  Ruxolitinib Phosphate (OPZELURA) 1.5 % CREA - Right Breast Apply to affected area breast twice daily until itch improved.  Nevus right upper arm  Benign-appearing.  Observation.  Call clinic for new or changing moles.  Recommend daily use of broad spectrum spf 30+ sunscreen to sun-exposed areas.   Dermatofibroma - Firm pink/brown papulenodule with dimple sign - Benign appearing - Call for any changes  Return in about 1 year (around 06/18/2023) for TBSE.  Lindi Adie, CMA, am acting as scribe for Brendolyn Patty, MD .  Documentation: I have reviewed the  above documentation for accuracy and completeness, and I agree with the above.  Brendolyn Patty MD

## 2022-07-17 ENCOUNTER — Other Ambulatory Visit (INDEPENDENT_AMBULATORY_CARE_PROVIDER_SITE_OTHER): Payer: Self-pay | Admitting: Nurse Practitioner

## 2022-07-18 NOTE — Telephone Encounter (Signed)
Patient will need to contact the office to make a follow up appointment

## 2022-08-14 ENCOUNTER — Other Ambulatory Visit (INDEPENDENT_AMBULATORY_CARE_PROVIDER_SITE_OTHER): Payer: Self-pay | Admitting: Nurse Practitioner

## 2022-08-14 NOTE — Telephone Encounter (Signed)
Patient needs to make a follow up appointment as soon as possible with [facility]

## 2022-08-23 IMAGING — MG DIGITAL SCREENING BILAT W/ TOMO W/ CAD
8 series · 8 of 24 positions shown · non-contrast
Comparison: Previous exam(s).

CLINICAL DATA: Screening.

EXAM:
DIGITAL SCREENING BILATERAL MAMMOGRAM WITH TOMO AND CAD

[R CC synth-2D]
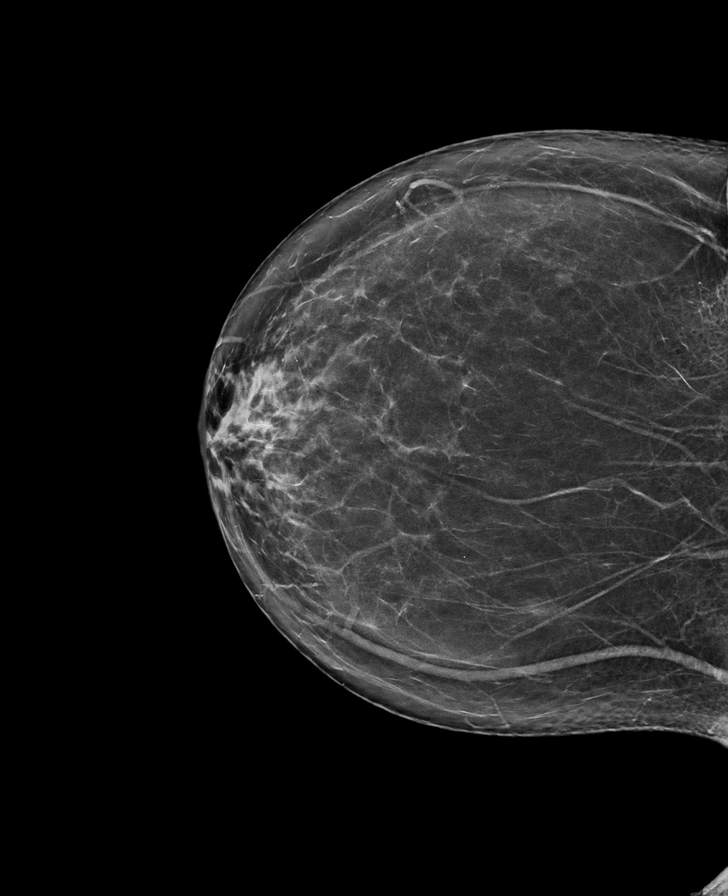

[L MLO synth-2D]
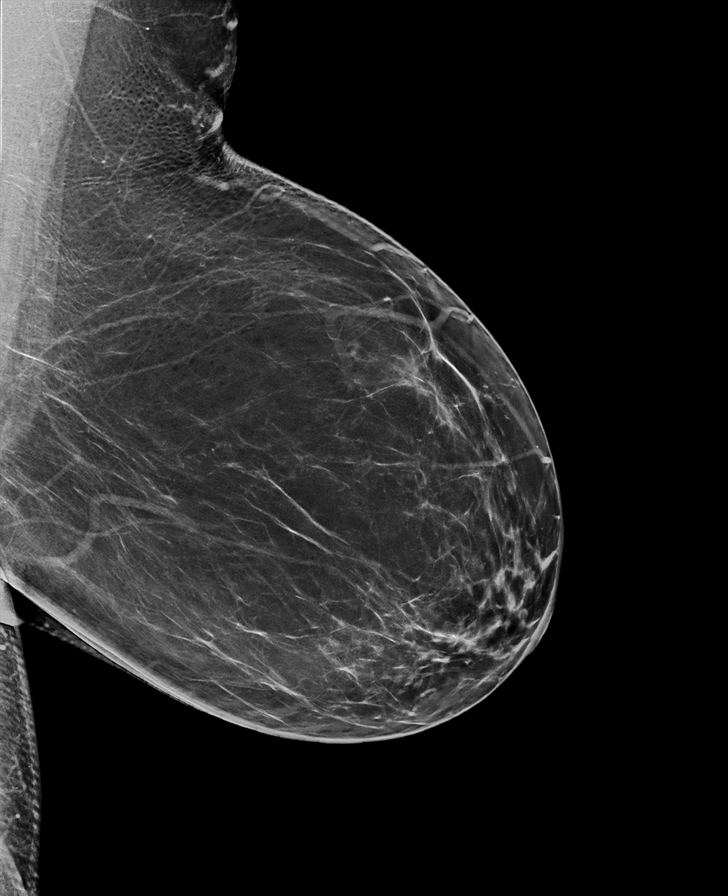

[L CC synth-2D]
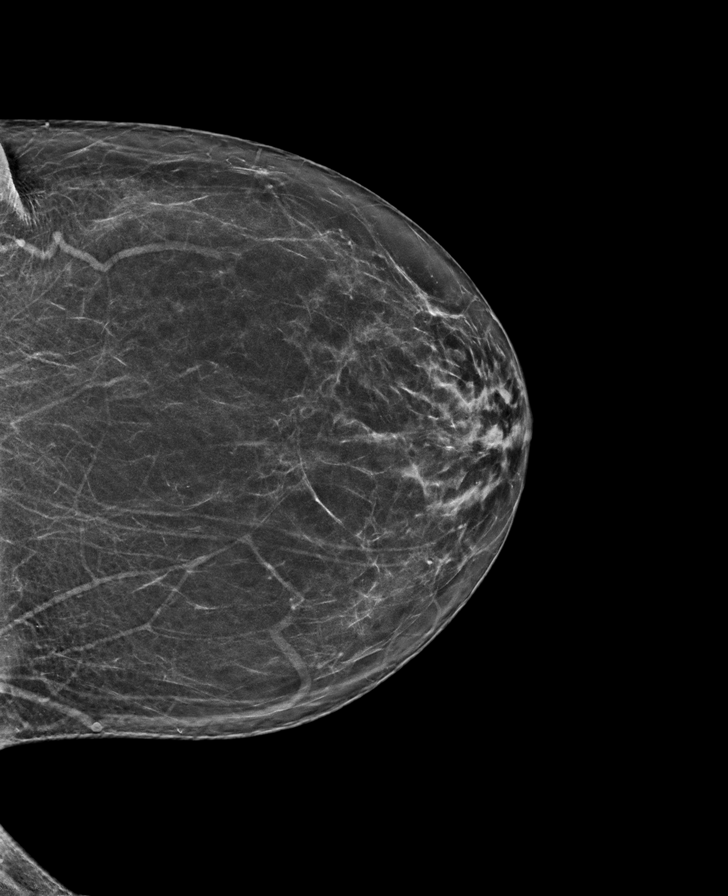

[R MLO synth-2D]
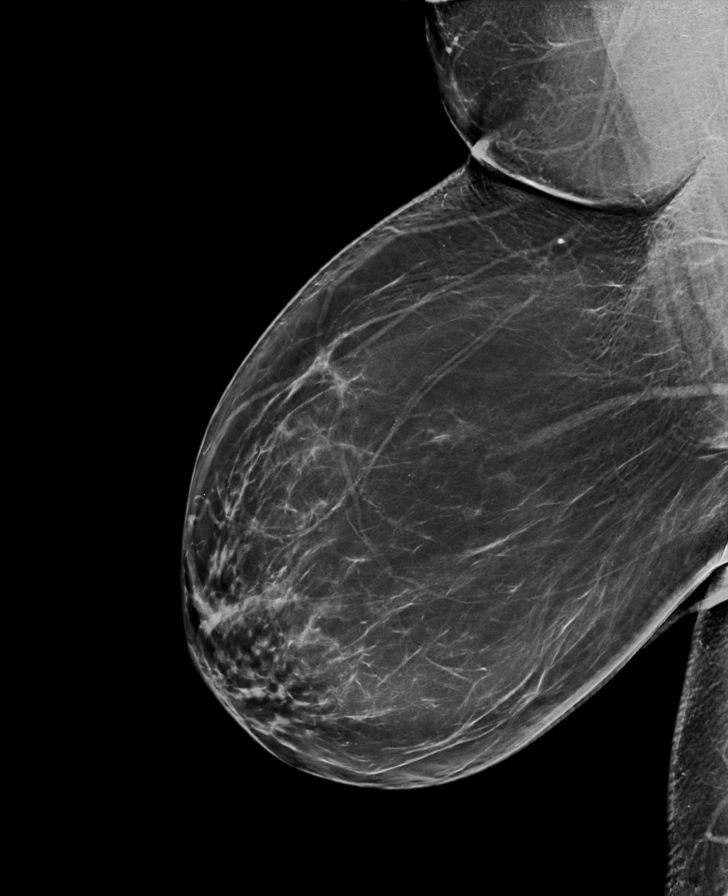

[L CC tomo · tomo slice 35/68.0]
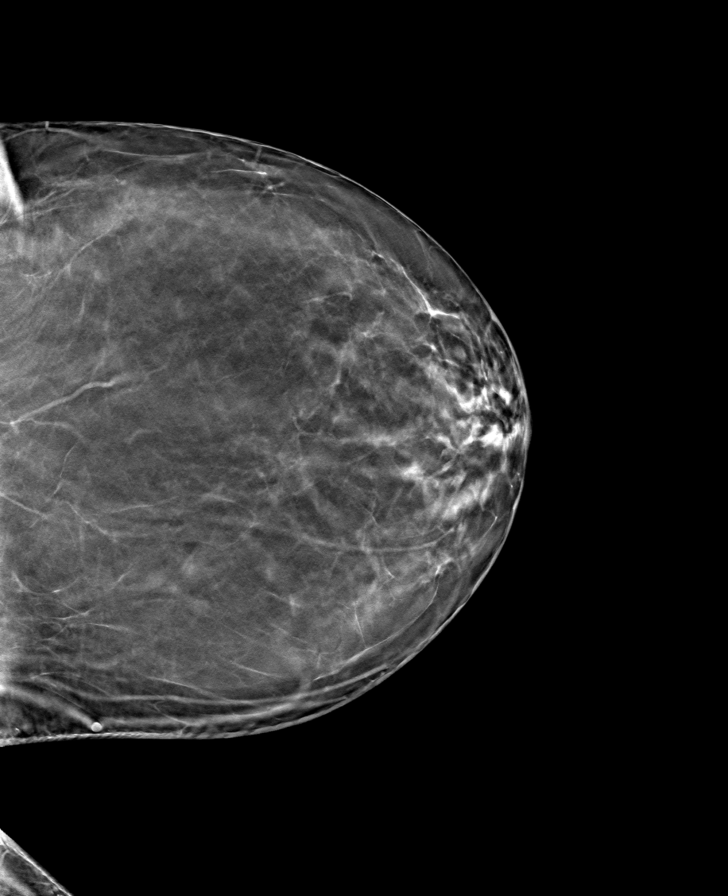

[L MLO tomo · tomo slice 41/80.0]
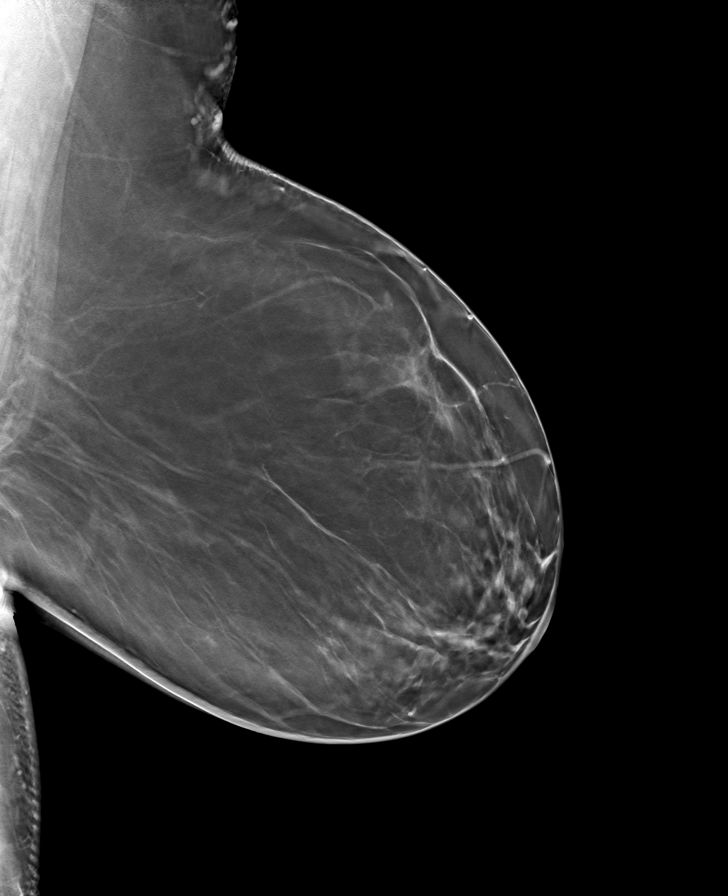

[R CC tomo · tomo slice 39/76.0]
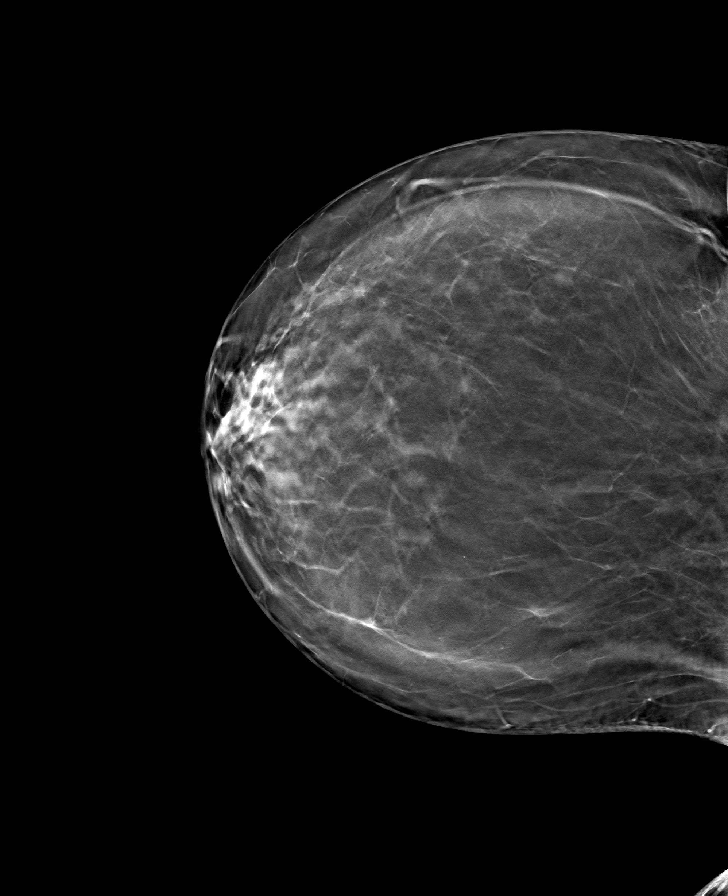

[R MLO tomo · tomo slice 43/85.0]
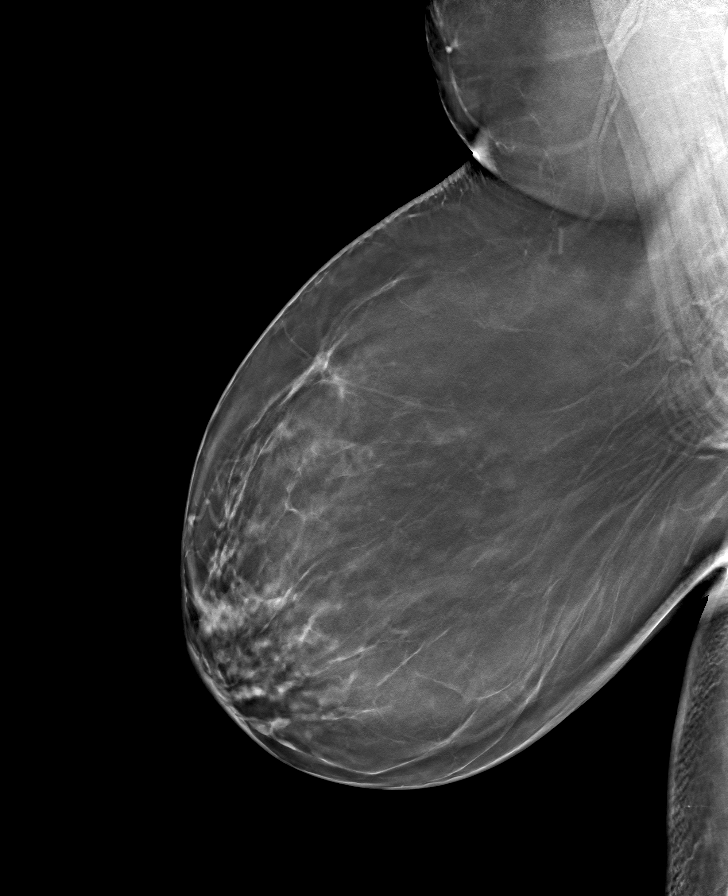

[8 of 24 positions shown; findings below may reference images not displayed]

ACR Breast Density Category b: There are scattered areas of
fibroglandular density.
FINDINGS: There are no findings suspicious for malignancy. Images were
processed with CAD.
IMPRESSION: No mammographic evidence of malignancy. A result letter of this
screening mammogram will be mailed directly to the patient.

RECOMMENDATION:
Screening mammogram in one year. (Code:[id number])

BI-RADS CATEGORY  1: Negative.

## 2022-08-27 ENCOUNTER — Ambulatory Visit: Payer: Managed Care, Other (non HMO) | Admitting: Dermatology

## 2022-09-17 ENCOUNTER — Ambulatory Visit: Payer: Managed Care, Other (non HMO) | Admitting: Dermatology

## 2022-09-17 ENCOUNTER — Encounter: Payer: Self-pay | Admitting: Dermatology

## 2022-09-17 VITALS — BP 120/77 | HR 86

## 2022-09-17 DIAGNOSIS — L738 Other specified follicular disorders: Secondary | ICD-10-CM

## 2022-09-17 DIAGNOSIS — D2239 Melanocytic nevi of other parts of face: Secondary | ICD-10-CM

## 2022-09-17 DIAGNOSIS — L309 Dermatitis, unspecified: Secondary | ICD-10-CM

## 2022-09-17 MED ORDER — OPZELURA 1.5 % EX CREA: TOPICAL_CREAM | CUTANEOUS | 1 refills | Status: DC

## 2022-09-17 NOTE — Progress Notes (Signed)
Follow-Up Visit   Subjective  Lisa Barry is a 57 y.o. female who presents for the following: Spots to be removed on face. Cosmetic removal.   The following portions of the chart were reviewed this encounter and updated as appropriate: medications, allergies, medical history  Review of Systems:  No other skin or systemic complaints except as noted in HPI or Assessment and Plan.  Objective  Well appearing patient in no apparent distress; mood and affect are within normal limits.  A focused examination was performed of the following areas: Face  Relevant physical exam findings are noted in the Assessment and Plan.  Glabella x 10, R malar cheek x 1 (11) Small yellow papules with a central dell        Right Nasal Ala Tiny firm white papule.       Assessment & Plan   Sebaceous hyperplasia Glabella x 10, R malar cheek x 1  Discussed cosmetic procedure (ED), noncovered.  $60 for 1st lesion and $15 for each additional lesion if done on the same day.  Maximum charge $350.  One touch-up treatment included no charge. Discussed risks of treatment including dyspigmentation, small scar, and/or recurrence. Recommend daily broad spectrum sunscreen SPF 30+/photoprotection to treated areas once healed.   ED x 11 today  Prior to procedure, discussed risks of small wound, skin dyspigmentation, or rare scar. Recommend Vaseline ointment to treated areas while healing.   Destruction of lesion - Glabella x 10, R malar cheek x 1  Destruction method: electrodesiccation and curettage   Destruction method comment:  Electrodesiccation only not curettage Informed consent: discussed and consent obtained   Timeout:  patient name, date of birth, surgical site, and procedure verified Patient was prepped and draped in usual sterile fashion: patient was prepped with isopropyl alcohol. Hemostasis achieved with:  electrodesiccation Outcome: patient tolerated procedure well with no complications    Post-procedure details: wound care instructions given    Dermatitis  Related Medications Ruxolitinib Phosphate (OPZELURA) 1.5 % CREA Apply to affected area breast twice daily until itch improved.  Fibrous papule of nose Right Nasal Ala  Discussed cosmetic procedure, noncovered.  $60 for 1st lesion and $15 for each additional lesion if done on the same day.  Maximum charge $350.  One touch-up treatment included no charge. Discussed risks of treatment including dyspigmentation, small scar, and/or recurrence. Recommend daily broad spectrum sunscreen SPF 30+/photoprotection to treated areas once healed.  ED x 1   Prior to procedure, discussed risks of small wound, skin dyspigmentation, or rare scar. Recommend Vaseline ointment to treated areas while healing. May recur.  Destruction of lesion - Right Nasal Ala  Destruction method: electrodesiccation and curettage   Destruction method comment:  Electrodesiccation only not curretage Informed consent: discussed and consent obtained   Timeout:  patient name, date of birth, surgical site, and procedure verified Patient was prepped and draped in usual sterile fashion: patient was prepped with isopropyl alcohol. Hemostasis achieved with:  electrodesiccation Outcome: patient tolerated procedure well with no complications   Post-procedure details: wound care instructions given     Dermatitis  Exam: Breast clear today.  Treatment Plan: Continue Opzelura Cream Apply cream twice a day to affected areas as needed. This should not be used long-term on more than 10% of your skin, and short term (up to 3 months) on up to 20% of your skin.    Return as scheduled.  ICherlyn Labella, CMA, am acting as scribe for Willeen Niece, MD .  Documentation: I have reviewed the above documentation for accuracy and completeness, and I agree with the above.  Brendolyn Patty, MD

## 2022-09-17 NOTE — Patient Instructions (Signed)
Due to recent changes in healthcare laws, you may see results of your pathology and/or laboratory studies on MyChart before the doctors have had a chance to review them. We understand that in some cases there may be results that are confusing or concerning to you. Please understand that not all results are received at the same time and often the doctors may need to interpret multiple results in order to provide you with the best plan of care or course of treatment. Therefore, we ask that you please give us 2 business days to thoroughly review all your results before contacting the office for clarification. Should we see a critical lab result, you will be contacted sooner.   If You Need Anything After Your Visit  If you have any questions or concerns for your doctor, please call our main line at [phone] and press option 4 to reach your doctor's medical assistant. If no one answers, please leave a voicemail as directed and we will return your call as soon as possible. Messages left after 4 pm will be answered the following business day.   You may also send us a message via MyChart. We typically respond to MyChart messages within 1-2 business days.  For prescription refills, please ask your pharmacy to contact our office. Our fax number is [phone].  If you have an urgent issue when the clinic is closed that cannot wait until the next business day, you can page your doctor at the number below.    Please note that while we do our best to be available for urgent issues outside of office hours, we are not available 24/7.   If you have an urgent issue and are unable to reach us, you may choose to seek medical care at your doctor's office, retail clinic, urgent care center, or emergency room.  If you have a medical emergency, please immediately call 911 or go to the emergency department.  Pager Numbers  - Dr. Kowalski: [phone]  - Dr. Moye: [phone]  - Dr. Stewart:  [phone]  In the event of inclement weather, please call our main line at [phone] for an update on the status of any delays or closures.  Dermatology Medication Tips: Please keep the boxes that topical medications come in in order to help keep track of the instructions about where and how to use these. Pharmacies typically print the medication instructions only on the boxes and not directly on the medication tubes.   If your medication is too expensive, please contact our office at [phone] option 4 or send us a message through MyChart.   We are unable to tell what your co-pay for medications will be in advance as this is different depending on your insurance coverage. However, we may be able to find a substitute medication at lower cost or fill out paperwork to get insurance to cover a needed medication.   If a prior authorization is required to get your medication covered by your insurance company, please allow us 1-2 business days to complete this process.  Drug prices often vary depending on where the prescription is filled and some pharmacies may offer cheaper prices.  The website www.goodrx.com contains coupons for medications through different pharmacies. The prices here do not account for what the cost may be with help from insurance (it may be cheaper with your insurance), but the website can give you the price if you did not use any insurance.  - You can print the associated coupon and take it with   your prescription to the pharmacy.  - You may also stop by our office during regular business hours and pick up a GoodRx coupon card.  - If you need your prescription sent electronically to a different pharmacy, notify our office through [facility] MyChart or by phone at [phone] option 4.     Si Usted Necesita Algo Despus de Su Visita  Tambin puede enviarnos un mensaje a travs de MyChart. Por lo general respondemos a los mensajes de MyChart en el transcurso de 1 a 2  das hbiles.  Para renovar recetas, por favor pida a su farmacia que se ponga en contacto con nuestra oficina. Nuestro nmero de fax es el [phone].  Si tiene un asunto urgente cuando la clnica est cerrada y que no puede esperar hasta el siguiente da hbil, puede llamar/localizar a su doctor(a) al nmero que aparece a continuacin.   Por favor, tenga en cuenta que aunque hacemos todo lo posible para estar disponibles para asuntos urgentes fuera del horario de oficina, no estamos disponibles las 24 horas del da, los 7 das de la semana.   Si tiene un problema urgente y no puede comunicarse con nosotros, puede optar por buscar atencin mdica  en el consultorio de su doctor(a), en una clnica privada, en un centro de atencin urgente o en una sala de emergencias.  Si tiene una emergencia mdica, por favor llame inmediatamente al 911 o vaya a la sala de emergencias.  Nmeros de bper  - Dr. Kowalski: [phone]  - Dra. Moye: [phone]  - Dra. Stewart: [phone]  En caso de inclemencias del tiempo, por favor llame a nuestra lnea principal al [phone] para una actualizacin sobre el estado de cualquier retraso o cierre.  Consejos para la medicacin en dermatologa: Por favor, guarde las cajas en las que vienen los medicamentos de uso tpico para ayudarle a seguir las instrucciones sobre dnde y cmo usarlos. Las farmacias generalmente imprimen las instrucciones del medicamento slo en las cajas y no directamente en los tubos del medicamento.   Si su medicamento es muy caro, por favor, pngase en contacto con nuestra oficina llamando al [phone] y presione la opcin 4 o envenos un mensaje a travs de MyChart.   No podemos decirle cul ser su copago por los medicamentos por adelantado ya que esto es diferente dependiendo de la cobertura de su seguro. Sin embargo, es posible que podamos encontrar un medicamento sustituto a menor costo o llenar un formulario para que el  seguro cubra el medicamento que se considera necesario.   Si se requiere una autorizacin previa para que su compaa de seguros cubra su medicamento, por favor permtanos de 1 a 2 das hbiles para completar este proceso.  Los precios de los medicamentos varan con frecuencia dependiendo del lugar de dnde se surte la receta y alguna farmacias pueden ofrecer precios ms baratos.  El sitio web www.goodrx.com tiene cupones para medicamentos de diferentes farmacias. Los precios aqu no tienen en cuenta lo que podra costar con la ayuda del seguro (puede ser ms barato con su seguro), pero el sitio web puede darle el precio si no utiliz ningn seguro.  - Puede imprimir el cupn correspondiente y llevarlo con su receta a la farmacia.  - Tambin puede pasar por nuestra oficina durante el horario de atencin regular y recoger una tarjeta de cupones de GoodRx.  - Si necesita que su receta se enve electrnicamente a una farmacia diferente, informe a nuestra oficina a travs de MyChart de [facility]   o por telfono llamando al [phone] y presione la opcin 4.  

## 2022-10-07 ENCOUNTER — Ambulatory Visit: Payer: Managed Care, Other (non HMO) | Admitting: Urology

## 2022-10-13 NOTE — Progress Notes (Deleted)
10/04/21 10:54 AM   Lisa Barry [birth year] [id number]  Referring provider:  Patrice Paradise, MD [address] [facility] [place],  [place] [address]   Urological history  1. OAB wet  - contributing factors of age, obesity, hydrocephalus and stroke  - Mybetriq 50 mg daily   HPI: Lisa Barry is a 57 y.o.female who presents today yearly follow up.   PVR ***   PMH: Past Medical History:  Diagnosis Date   Blood clot associated with vein wall inflammation 2008   Factor V Leiden (HCC) 2008   Hypertension    TIA (transient ischemic attack) 11/2018    Surgical History: Past Surgical History:  Procedure Laterality Date   ABDOMINAL HYSTERECTOMY     BACK SURGERY     CHOLECYSTECTOMY     LUMBAR PERITONEAL SHUNT  2000    Home Medications:  Allergies as of 10/04/2021       Reactions   Ace Inhibitors Other (See Comments)   Corticosteroids Other (See Comments)   Eye condition states may or may not be the cause Eye condition states may or may not be the cause   Other Other (See Comments)   AVOID STEROIDS DUE TO PREVIOUS EYE CONDITION BIRTH CONTROL PILLS   Penicillins Other (See Comments)   Sulfa Antibiotics Other (See Comments)   Other reaction(s): Unknown Other reaction(s): Unknown   Tetracycline Other (See Comments)   Tetracyclines & Related    Other reaction(s): Unknown        Medication List        Accurate as of Oct 04, 2021 10:54 AM. If you have any questions, ask your nurse or doctor.          STOP taking these medications    Trospium Chloride 60 MG Cp24       TAKE these medications    acyclovir ointment 5 % Commonly known as: Zovirax Apply to aa's QID   ALPRAZolam 0.5 MG tablet Commonly known as: XANAX Take 0.5 mg by mouth daily as needed.   amLODipine 10 MG tablet Commonly known as: NORVASC Take 5 mg by mouth.   atorvastatin 80 MG tablet Commonly known as: LIPITOR Take by mouth.   Ciclopirox 1 % shampoo 2-3  times a week lather on scalp, leave on 5-8 minutes, rinse well.   citalopram 20 MG tablet Commonly known as: CELEXA Take by mouth.   Eliquis 2.5 MG Tabs tablet Generic drug: apixaban TAKE 1 TABLET BY MOUTH  TWICE DAILY   fluocinolone 0.01 % external solution Commonly known as: SYNALAR Apply to aa's scalp BID   hyoscyamine 0.375 MG 12 hr tablet Commonly known as: LEVBID   ketoconazole 2 % shampoo Commonly known as: NIZORAL APPLY TO SCALP 2-3 TIMES PER WEEK. LET SIT 5-8 MINUTES BEFORE RINSING.   metoprolol succinate 25 MG 24 hr tablet Commonly known as: TOPROL-XL TAKE 1 TABLET BY MOUTH EVERY DAY   METRONIDAZOLE (TOPICAL) 0.75 % Lotn Apply 1-2 times daily as needed for rosacea   mirabegron ER 50 MG Tb24 tablet Commonly known as: MYRBETRIQ Take 1 tablet (50 mg total) by mouth daily.   mometasone 0.1 % lotion Commonly known as: ELOCON Apply qd-bid to affected areas on scalp   ondansetron 4 MG tablet Commonly known as: ZOFRAN Take 4 mg by mouth every 8 (eight) hours as needed.   valACYclovir 500 MG tablet Commonly known as: VALTREX Take by mouth.   valsartan-hydrochlorothiazide 320-12.5 MG tablet Commonly known as: DIOVAN-HCT Take by  mouth.   Wegovy 0.25 MG/0.5ML Soaj Generic drug: Semaglutide-Weight Management   zolpidem 5 MG tablet Commonly known as: AMBIEN TAKE 1 TABLET BY MOUTH NIGHTLY AS NEEDED FOR SLEEP        Allergies:  Allergies  Allergen Reactions   Ace Inhibitors Other (See Comments)   Corticosteroids Other (See Comments)    Eye condition states may or may not be the cause Eye condition states may or may not be the cause    Other Other (See Comments)    AVOID STEROIDS DUE TO PREVIOUS EYE CONDITION  BIRTH CONTROL PILLS   Penicillins Other (See Comments)   Sulfa Antibiotics Other (See Comments)    Other reaction(s): Unknown Other reaction(s): Unknown   Tetracycline Other (See Comments)   Tetracyclines & Related     Other reaction(s):  Unknown    Family History: Family History  Problem Relation Age of Onset   Hypertension Mother    CAD Mother    COPD Mother    Bowel Disease Father    Stroke Father    Liver disease Sister    Bladder Cancer Neg Hx    Kidney cancer Neg Hx    Prostate cancer Neg Hx    Breast cancer Neg Hx     Social History:  reports that she has never smoked. She has never used smokeless tobacco. She reports current alcohol use of about 2.0 standard drinks per week. She reports that she does not use drugs.   Physical Exam: BP 127/85   Pulse 88   Ht 5\' 5"  (1.651 m)   Wt (!) 305 lb (138.3 kg)   BMI 50.75 kg/m   Constitutional:  Well nourished. Alert and oriented, No acute distress. HEENT: [place] AT, moist mucus membranes.  Trachea midline, no masses. Cardiovascular: No clubbing, cyanosis, or edema. Respiratory: Normal respiratory effort, no increased work of breathing. GU: No CVA tenderness.  No bladder fullness or masses. Vulvovaginal atrophy w/ pallor, loss of rugae, introital retraction, excoriations.  Vulvar thinning, fusion of labia, clitoral hood retraction, prominent urethral meatus.   *** external genitalia, *** pubic hair distribution, no lesions.  Normal urethral meatus, no lesions, no prolapse, no discharge.   No urethral masses, tenderness and/or tenderness. No bladder fullness, tenderness or masses. *** vagina mucosa, *** estrogen effect, no discharge, no lesions, *** pelvic support, *** cystocele and *** rectocele noted.  No cervical motion tenderness.  Uterus is freely mobile and non-fixed.  No adnexal/parametria masses or tenderness noted.  Anus and perineum are without rashes or lesions.   ***  Neurologic: Grossly intact, no focal deficits, moving all 4 extremities. Psychiatric: Normal mood and affect.    Laboratory Data: Urinalysis Urinalysis w/Microscopic Order: [id number] Component Ref Range & Units 3 wk ago  Color Colorless, Straw, Light Yellow, Yellow, Dark Yellow Light Yellow   Clarity Clear Clear  Specific Gravity 1.005 - 1.030 1.022  pH, Urine 5.0 - 8.0 7.0  Protein, Urinalysis Negative mg/dL Negative  Glucose, Urinalysis Negative mg/dL Negative  Ketones, Urinalysis Negative mg/dL Negative  Blood, Urinalysis Negative Negative  Nitrite, Urinalysis Negative Negative  Leukocyte Esterase, Urinalysis Negative Trace Abnormal   Bilirubin, Urinalysis Negative Negative  Urobilinogen, Urinalysis 0.2 - 1.0 mg/dL 0.2  WBC, UA <=5 /hpf 3  Red Blood Cells, Urinalysis <=3 /hpf 1  Bacteria, Urinalysis 0 - 5 /hpf 0-5  Squamous Epithelial Cells, Urinalysis /hpf 0  Resulting Agency [facility]   Specimen Collected: 09/18/22 10:04   Performed by: [facility] Last Resulted: 09/18/22 10:32  Received From: [facility]  Result Received: 10/13/22 09:01  I have reviewed the labs.   Pertinent Imaging: No results found for any visits on 10/04/21.  Assessment & Plan:    1. OAB wet - She experienced severe dry mouth on trospium she is interested on going back on the myrbetriq again today  - Myrbetriq 50 mg; prescribed  - Patient will follow-up in 1 year for OAB questionnaire and PVR.    Return in about 1 year (around 10/05/2022) for PVR and OAB questionnaire.  Cloretta Ned   [facility] [address], [address] [place], [place] [address] [phone]

## 2022-10-14 ENCOUNTER — Ambulatory Visit: Payer: Managed Care, Other (non HMO) | Admitting: Urology

## 2022-10-21 ENCOUNTER — Ambulatory Visit: Payer: Managed Care, Other (non HMO) | Admitting: Urology

## 2022-10-31 ENCOUNTER — Other Ambulatory Visit: Payer: Self-pay

## 2022-10-31 DIAGNOSIS — N3281 Overactive bladder: Secondary | ICD-10-CM

## 2022-10-31 MED ORDER — MIRABEGRON ER 50 MG PO TB24
50.0000 mg | ORAL_TABLET | Freq: Every day | ORAL | 0 refills | Status: DC
Start: 2022-10-31 — End: 2022-11-12

## 2022-11-11 ENCOUNTER — Ambulatory Visit: Payer: Managed Care, Other (non HMO) | Admitting: Urology

## 2022-11-11 NOTE — Progress Notes (Unsigned)
10/04/21 10:54 AM   Lisa Barry Nov 23, 1965 [id number]  Referring provider:  Patrice Paradise, MD [address] [facility] [facility] [facility] [place],  [place] [address]   Urological history  1. OAB wet  - contributing factors of age, obesity, hydrocephalus and stroke  - Mybetriq 50 mg daily   HPI: Lisa Barry is a 57 y.o.female who presents today yearly follow up.   She is having 1-7 daytime urinations, 1-2 episodes of nocturia and a mild urge to urinate.  She does have urge incontinence.  She leaks 1-2 times a week.  She wears 3 panty liners daily.  She does engage in toilet mapping.  She does limit fluid intake.  She does have an odor to her urine at times.  She does rarely Mitt she does not drink enough fluid.  PVR 0 mL   She continues to take the Myrbetriq 50 mg daily.  It is working well for her.   PMH: Past Medical History:  Diagnosis Date   Blood clot associated with vein wall inflammation 2008   Factor V Leiden (HCC) 2008   Hypertension    TIA (transient ischemic attack) 11/2018    Surgical History: Past Surgical History:  Procedure Laterality Date   ABDOMINAL HYSTERECTOMY     BACK SURGERY     CHOLECYSTECTOMY     LUMBAR PERITONEAL SHUNT  2000    Home Medications:  Allergies as of 10/04/2021       Reactions   Ace Inhibitors Other (See Comments)   Corticosteroids Other (See Comments)   Eye condition states may or may not be the cause Eye condition states may or may not be the cause   Other Other (See Comments)   AVOID STEROIDS DUE TO PREVIOUS EYE CONDITION BIRTH CONTROL PILLS   Penicillins Other (See Comments)   Sulfa Antibiotics Other (See Comments)   Other reaction(s): Unknown Other reaction(s): Unknown   Tetracycline Other (See Comments)   Tetracyclines & Related    Other reaction(s): Unknown        Medication List        Accurate as of Oct 04, 2021 10:54 AM. If you have any questions, ask your nurse or doctor.          STOP  taking these medications    Trospium Chloride 60 MG Cp24       TAKE these medications    acyclovir ointment 5 % Commonly known as: Zovirax Apply to aa's QID   ALPRAZolam 0.5 MG tablet Commonly known as: XANAX Take 0.5 mg by mouth daily as needed.   amLODipine 10 MG tablet Commonly known as: NORVASC Take 5 mg by mouth.   atorvastatin 80 MG tablet Commonly known as: LIPITOR Take by mouth.   Ciclopirox 1 % shampoo 2-3 times a week lather on scalp, leave on 5-8 minutes, rinse well.   citalopram 20 MG tablet Commonly known as: CELEXA Take by mouth.   Eliquis 2.5 MG Tabs tablet Generic drug: apixaban TAKE 1 TABLET BY MOUTH  TWICE DAILY   fluocinolone 0.01 % external solution Commonly known as: SYNALAR Apply to aa's scalp BID   hyoscyamine 0.375 MG 12 hr tablet Commonly known as: LEVBID   ketoconazole 2 % shampoo Commonly known as: NIZORAL APPLY TO SCALP 2-3 TIMES PER WEEK. LET SIT 5-8 MINUTES BEFORE RINSING.   metoprolol succinate 25 MG 24 hr tablet Commonly known as: TOPROL-XL TAKE 1 TABLET BY MOUTH EVERY DAY   METRONIDAZOLE (TOPICAL) 0.75 % Lotn Apply  1-2 times daily as needed for rosacea   mirabegron ER 50 MG Tb24 tablet Commonly known as: MYRBETRIQ Take 1 tablet (50 mg total) by mouth daily.   mometasone 0.1 % lotion Commonly known as: ELOCON Apply qd-bid to affected areas on scalp   ondansetron 4 MG tablet Commonly known as: ZOFRAN Take 4 mg by mouth every 8 (eight) hours as needed.   valACYclovir 500 MG tablet Commonly known as: VALTREX Take by mouth.   valsartan-hydrochlorothiazide 320-12.5 MG tablet Commonly known as: DIOVAN-HCT Take by mouth.   Wegovy 0.25 MG/0.5ML Soaj Generic drug: Semaglutide-Weight Management   zolpidem 5 MG tablet Commonly known as: AMBIEN TAKE 1 TABLET BY MOUTH NIGHTLY AS NEEDED FOR SLEEP        Allergies:  Allergies  Allergen Reactions   Ace Inhibitors Other (See Comments)   Corticosteroids Other (See  Comments)    Eye condition states may or may not be the cause Eye condition states may or may not be the cause    Other Other (See Comments)    AVOID STEROIDS DUE TO PREVIOUS EYE CONDITION  BIRTH CONTROL PILLS   Penicillins Other (See Comments)   Sulfa Antibiotics Other (See Comments)    Other reaction(s): Unknown Other reaction(s): Unknown   Tetracycline Other (See Comments)   Tetracyclines & Related     Other reaction(s): Unknown    Family History: Family History  Problem Relation Age of Onset   Hypertension Mother    CAD Mother    COPD Mother    Bowel Disease Father    Stroke Father    Liver disease Sister    Bladder Cancer Neg Hx    Kidney cancer Neg Hx    Prostate cancer Neg Hx    Breast cancer Neg Hx     Social History:  reports that she has never smoked. She has never used smokeless tobacco. She reports current alcohol use of about 2.0 standard drinks per week. She reports that she does not use drugs.   Physical Exam: BP 127/85   Pulse 88   Ht 5\' 5"  (1.651 m)   Wt (!) 305 lb (138.3 kg)   BMI 50.75 kg/m   Constitutional:  Well nourished. Alert and oriented, No acute distress. HEENT: [place] AT, moist mucus membranes.  Trachea midline Cardiovascular: No clubbing, cyanosis, or edema. Respiratory: Normal respiratory effort, no increased work of breathing. Neurologic: Grossly intact, no focal deficits, moving all 4 extremities. Psychiatric: Normal mood and affect.    Laboratory Data: Urinalysis Urinalysis w/Microscopic Order: [id number] Component Ref Range & Units 3 wk ago  Color Colorless, Straw, Light Yellow, Yellow, Dark Yellow Light Yellow  Clarity Clear Clear  Specific Gravity 1.005 - 1.030 1.022  pH, Urine 5.0 - 8.0 7.0  Protein, Urinalysis Negative mg/dL Negative  Glucose, Urinalysis Negative mg/dL Negative  Ketones, Urinalysis Negative mg/dL Negative  Blood, Urinalysis Negative Negative  Nitrite, Urinalysis Negative Negative  Leukocyte  Esterase, Urinalysis Negative Trace Abnormal   Bilirubin, Urinalysis Negative Negative  Urobilinogen, Urinalysis 0.2 - 1.0 mg/dL 0.2  WBC, UA <=5 /hpf 3  Red Blood Cells, Urinalysis <=3 /hpf 1  Bacteria, Urinalysis 0 - 5 /hpf 0-5  Squamous Epithelial Cells, Urinalysis /hpf 0  Resulting Agency [facility]   Specimen Collected: 09/18/22 10:04   Performed by: [facility] Last Resulted: 09/18/22 10:32  Received From: [facility]  Result Received: 10/13/22 09:01  I have reviewed the labs.   Pertinent Imaging:  11/12/22 13:15  Scan Result 0   Assessment & Plan:    1. OAB wet -at goal with Myrbetriq 50 mg daily -Continue Myrbetriq 50 mg daily  2. Malodorous urine -Explained to the patient that without other urinary symptoms, this is not a sign of infection but most likely dehydration -I encouraged her to increase her water intake slowly over the next several months so it would make it more sustainable to do so rather than forcing a large amount of water straightaway  Return in about 1 year (around 10/05/2022) for PVR and OAB questionnaire.  Cloretta Ned   [facility] [address] [place], [place] [address] [phone]

## 2022-11-12 ENCOUNTER — Ambulatory Visit: Payer: Managed Care, Other (non HMO) | Admitting: Urology

## 2022-11-12 ENCOUNTER — Encounter: Payer: Self-pay | Admitting: Urology

## 2022-11-12 VITALS — BP 134/88 | HR 91 | Ht 65.0 in | Wt 285.0 lb

## 2022-11-12 DIAGNOSIS — R32 Unspecified urinary incontinence: Secondary | ICD-10-CM | POA: Diagnosis not present

## 2022-11-12 DIAGNOSIS — N3281 Overactive bladder: Secondary | ICD-10-CM

## 2022-11-12 DIAGNOSIS — R8290 Unspecified abnormal findings in urine: Secondary | ICD-10-CM

## 2022-11-12 LAB — BLADDER SCAN AMB NON-IMAGING: Scan Result: 0

## 2022-11-12 MED ORDER — MIRABEGRON ER 50 MG PO TB24
50.0000 mg | ORAL_TABLET | Freq: Every day | ORAL | 3 refills | Status: DC
Start: 2022-11-12 — End: 2023-11-09

## 2022-12-09 ENCOUNTER — Other Ambulatory Visit: Payer: Self-pay | Admitting: Physician Assistant

## 2022-12-09 DIAGNOSIS — Z1231 Encounter for screening mammogram for malignant neoplasm of breast: Secondary | ICD-10-CM

## 2023-02-16 ENCOUNTER — Ambulatory Visit
Admission: RE | Admit: 2023-02-16 | Discharge: 2023-02-16 | Disposition: A | Discharge status: Home / Self Care | Payer: Managed Care, Other (non HMO) | Source: Ambulatory Visit | Attending: Physician Assistant | Admitting: Physician Assistant

## 2023-02-16 DIAGNOSIS — Z1231 Encounter for screening mammogram for malignant neoplasm of breast: Secondary | ICD-10-CM | POA: Insufficient documentation

## 2023-03-23 ENCOUNTER — Other Ambulatory Visit: Payer: Self-pay | Admitting: Dermatology

## 2023-03-23 DIAGNOSIS — L219 Seborrheic dermatitis, unspecified: Secondary | ICD-10-CM

## 2023-04-13 ENCOUNTER — Telehealth: Payer: Self-pay

## 2023-04-13 ENCOUNTER — Other Ambulatory Visit: Payer: Self-pay | Admitting: Dermatology

## 2023-04-13 DIAGNOSIS — L719 Rosacea, unspecified: Secondary | ICD-10-CM

## 2023-04-13 MED ORDER — METRONIDAZOLE 0.75 % EX LOTN: TOPICAL_LOTION | CUTANEOUS | 1 refills | Status: DC

## 2023-04-13 NOTE — Telephone Encounter (Signed)
Patient requesting refills of Metronidazole 0.75% lotion for rosacea to be sent to [facility].

## 2023-06-23 ENCOUNTER — Encounter: Payer: Managed Care, Other (non HMO) | Admitting: Dermatology

## 2023-09-16 ENCOUNTER — Emergency Department

## 2023-09-16 DIAGNOSIS — Y92002 Bathroom of unspecified non-institutional (private) residence single-family (private) house as the place of occurrence of the external cause: Secondary | ICD-10-CM | POA: Insufficient documentation

## 2023-09-16 DIAGNOSIS — I10 Essential (primary) hypertension: Secondary | ICD-10-CM | POA: Diagnosis not present

## 2023-09-16 DIAGNOSIS — S42301B Unspecified fracture of shaft of humerus, right arm, initial encounter for open fracture: Secondary | ICD-10-CM | POA: Insufficient documentation

## 2023-09-16 DIAGNOSIS — Z7901 Long term (current) use of anticoagulants: Secondary | ICD-10-CM | POA: Insufficient documentation

## 2023-09-16 DIAGNOSIS — Y93E1 Activity, personal bathing and showering: Secondary | ICD-10-CM | POA: Diagnosis not present

## 2023-09-16 DIAGNOSIS — Z8673 Personal history of transient ischemic attack (TIA), and cerebral infarction without residual deficits: Secondary | ICD-10-CM | POA: Insufficient documentation

## 2023-09-16 DIAGNOSIS — S4991XA Unspecified injury of right shoulder and upper arm, initial encounter: Secondary | ICD-10-CM | POA: Diagnosis present

## 2023-09-16 DIAGNOSIS — W16212A Fall in (into) filled bathtub causing other injury, initial encounter: Secondary | ICD-10-CM | POA: Diagnosis not present

## 2023-09-16 NOTE — ED Triage Notes (Signed)
 EMS brings pt in from home; fell in BR while changing clothes; hit rt upper arm on bathtub

## 2023-09-17 ENCOUNTER — Ambulatory Visit: Admitting: Dermatology

## 2023-09-17 ENCOUNTER — Emergency Department
Admission: EM | Admit: 2023-09-17 | Discharge: 2023-09-17 | Disposition: A | Discharge status: Home / Self Care | Attending: Emergency Medicine | Admitting: Emergency Medicine

## 2023-09-17 ENCOUNTER — Other Ambulatory Visit: Payer: Self-pay

## 2023-09-17 DIAGNOSIS — S42301A Unspecified fracture of shaft of humerus, right arm, initial encounter for closed fracture: Secondary | ICD-10-CM

## 2023-09-17 DIAGNOSIS — W19XXXA Unspecified fall, initial encounter: Secondary | ICD-10-CM

## 2023-09-17 MED ORDER — OXYCODONE-ACETAMINOPHEN 5-325 MG PO TABS
1.0000 | ORAL_TABLET | ORAL | 0 refills | Status: DC | PRN
Start: 2023-09-17 — End: 2023-11-19

## 2023-09-17 MED ORDER — OXYCODONE-ACETAMINOPHEN 5-325 MG PO TABS
1.0000 | ORAL_TABLET | Freq: Once | ORAL | Status: AC
  Administered 2023-09-17: 1 via ORAL
  Filled 2023-09-17: qty 1

## 2023-09-17 NOTE — ED Notes (Signed)
 Pt declining to have repeat vitals checked prior to departure.

## 2023-09-17 NOTE — ED Triage Notes (Signed)
 Patient C/O right arm pain after falling in the bathroom. Patient states that she hit her arm on the side of her bathtub. Patient received fentanyl, and zofran  via EMS.

## 2023-09-17 NOTE — ED Provider Notes (Signed)
 [facility] Provider Note    Event Date/Time   First MD Initiated Contact with Patient 09/17/23 [phone]     (approximate)   History   Chief Complaint Arm Pain   HPI  Lisa Barry is a 57 y.o. female with past medical history of hypertension, TIA, and factor V Leiden on Eliquis  who presents to the ED complaining of arm pain.  Patient reports that she was attempting to change her close in the bathroom this evening when she lost her balance and fell, striking her right arm on the edge of the bathtub.  She had immediate onset of pain near the middle of her right upper arm.  She denies hitting her head or losing consciousness, denies any pain in her lower extremities.     Physical Exam   Triage Vital Signs: ED Triage Vitals  Encounter Vitals Group     BP 09/17/23 0002 121/61     Systolic BP Percentile --      Diastolic BP Percentile --      Pulse Rate 09/17/23 0002 77     Resp 09/17/23 0002 18     Temp 09/17/23 0002 97.7 F (36.5 C)     Temp Source 09/17/23 0002 Oral     SpO2 09/16/23 2341 93 %     Weight 09/17/23 0003 280 lb (127 kg)     Height 09/17/23 0003 5\' 5"  (1.651 m)     Head Circumference --      Peak Flow --      Pain Score 09/17/23 0002 6     Pain Loc --      Pain Education --      Exclude from Growth Chart --     Most recent vital signs: Vitals:   09/16/23 2341 09/17/23 0002  BP:  121/61  Pulse:  77  Resp:  18  Temp:  97.7 F (36.5 C)  SpO2: 93% 96%    Constitutional: Alert and oriented. Eyes: Conjunctivae are normal. Head: Atraumatic. Nose: No congestion/rhinnorhea. Mouth/Throat: Mucous membranes are moist.  Cardiovascular: Normal rate, regular rhythm. Grossly normal heart sounds.  2+ radial pulses bilaterally. Respiratory: Normal respiratory effort.  No retractions. Lungs CTAB. Gastrointestinal: Soft and nontender. No distention. Musculoskeletal: No lower extremity tenderness nor edema.  Diffuse tenderness to palpation of  right upper arm with no tenderness at the elbow or shoulder.  No tenderness to palpation of right wrist. Neurologic:  Normal speech and language. No gross focal neurologic deficits are appreciated.    ED Results / Procedures / Treatments   Labs (all labs ordered are listed, but only abnormal results are displayed) Labs Reviewed - No data to display   RADIOLOGY Right humerus x-ray reviewed and interpreted by me with midshaft fracture.  PROCEDURES:  Critical Care performed: No  Procedures   MEDICATIONS ORDERED IN ED: Medications  oxyCODONE -acetaminophen  (PERCOCET/ROXICET) 5-325 MG per tablet 1 tablet (1 tablet Oral Given 09/17/23 0337)     IMPRESSION / MDM / ASSESSMENT AND PLAN / ED COURSE  I reviewed the triage vital signs and the nursing notes.                              58 y.o. female with past medical history of hypertension, factor V Leiden on Eliquis , and TIA who presents to the ED complaining of fall with pain in the middle of her upper arm.  Patient's presentation is most consistent with  acute complicated illness / injury requiring diagnostic workup.  Differential diagnosis includes, but is not limited to, fracture, dislocation, contusion.  Patient nontoxic-appearing and in no acute distress, vital signs are unremarkable.  She is neurovascular intact to her distal right upper extremity, x-ray does show midshaft fracture of the right humerus.  No evidence of traumatic injury to her shoulder or elbow.  Findings discussed with Dr. Daun Epstein of orthopedics, who recommends against coaptation splint due to large size of her arm and potential for skin ulceration.  Patient was placed in a shoulder immobilizer and will refer to orthopedic trauma in [place].  She was counseled to return to the ED for new or worsening symptoms, patient and husband agree with plan.      FINAL CLINICAL IMPRESSION(S) / ED DIAGNOSES   Final diagnoses:  Fall, initial encounter  Closed fracture of  shaft of right humerus, unspecified fracture morphology, initial encounter     Rx / DC Orders   ED Discharge Orders          Ordered    oxyCODONE -acetaminophen  (PERCOCET) 5-325 MG tablet  Every 4 hours PRN        09/17/23 0345             Note:  This document was prepared using Dragon voice recognition software and may include unintentional dictation errors.   Twilla Galea, MD 09/17/23 [phone]

## 2023-09-24 ENCOUNTER — Ambulatory Visit: Admitting: Dermatology

## 2023-11-07 ENCOUNTER — Other Ambulatory Visit: Payer: Self-pay | Admitting: Urology

## 2023-11-07 DIAGNOSIS — N3281 Overactive bladder: Secondary | ICD-10-CM

## 2023-11-07 DIAGNOSIS — R32 Unspecified urinary incontinence: Secondary | ICD-10-CM

## 2023-11-12 ENCOUNTER — Ambulatory Visit: Payer: Managed Care, Other (non HMO) | Admitting: Urology

## 2023-11-18 NOTE — Progress Notes (Unsigned)
 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   Previous records reviewed.     She has been taking Myrbetriq  50 mg daily.  She continues to have urinary frequency and nocturia x 1-2.  She feels she drinks a lot of liquids during the day, but she cannot be accurate with the amount of liquid she takes in.  She mostly drinks diet Dr. Nunzio, diet [address], cranberry juice, orange juice, ginger ale and water.  Patient denies any modifying or aggravating factors.  Patient denies any recent UTI's, gross hematuria, dysuria or suprapubic/flank pain.  Patient denies any fevers, chills, nausea or vomiting.    She states that she had a odor to her urine and she seen her primary care provider on a few occasion and was given antibiotics.  I could not find any urine cultures performed.  The antibiotics did not affect the order.  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   Factor V Leiden (HCC) 2008   Hypertension    TIA (transient ischemic attack) 11/2018    Surgical History: Past Surgical History:  Procedure Laterality Date   ABDOMINAL HYSTERECTOMY     BACK SURGERY     CHOLECYSTECTOMY     LUMBAR PERITONEAL SHUNT  2000    Home Medications:  Allergies as of 11/19/2023       Reactions   Ace Inhibitors Other (See Comments)   Corticosteroids Other (See Comments)   Eye condition states may or may not be the cause Eye condition states may or may not be the cause   Other Other (See Comments)   AVOID STEROIDS DUE TO  PREVIOUS EYE CONDITION BIRTH CONTROL PILLS   Penicillins Other (See Comments)   Sulfa Antibiotics Other (See Comments)   Other reaction(s): Unknown Other reaction(s): Unknown   Tetracycline Other (See Comments)   Tetracyclines & Related    Other reaction(s): Unknown        Medication List        Accurate as of November 19, 2023  2:01 PM. If you have any questions, ask your nurse or doctor.          acyclovir  ointment 5 % Commonly known as: Zovirax  Apply to aa's QID   ALPRAZolam 0.5 MG tablet Commonly known as: XANAX Take 0.5 mg by mouth daily as needed.   amLODipine 10 MG tablet Commonly known as: NORVASC Take 5 mg by mouth.   atorvastatin 80 MG tablet Commonly known as: LIPITOR Take by mouth.   citalopram  20 MG tablet Commonly known as: CELEXA  Take by mouth.   Eliquis  2.5 MG Tabs tablet Generic drug: apixaban  TAKE 1 TABLET BY MOUTH TWICE  DAILY   hyoscyamine 0.375 MG 12 hr tablet Commonly known as: LEVBID   ketoconazole  2 % shampoo Commonly known as: NIZORAL  WASH SCALP 2-3 TIMES WEEKLY, LET SIT 5 MINUTES BEFORE RINSING OUT, AS DIRECTED   metoprolol succinate 25 MG 24 hr tablet Commonly known as: TOPROL-XL TAKE 1 TABLET BY MOUTH  EVERY DAY   METRONIDAZOLE  (TOPICAL) 0.75 % Lotn APPLY TOPICALLY 1 TO 2 TIMES  DAILY AS NEEDED FOR ROSACEA   mirabegron  ER 50 MG Tb24 tablet Commonly known as: MYRBETRIQ  Take 1 tablet (50 mg total) by mouth daily.   ondansetron  4 MG tablet Commonly known as: ZOFRAN  Take 4 mg by mouth every 8 (eight) hours as needed.   oxyCODONE -acetaminophen  5-325 MG tablet Commonly known as: Percocet Take 1 tablet by mouth every 4 (four) hours as needed.   valACYclovir  500 MG tablet Commonly known as: VALTREX  Take by mouth.   valsartan-hydrochlorothiazide 320-12.5 MG tablet Commonly known as: DIOVAN-HCT Take by mouth.   Wegovy 0.25 MG/0.5ML Soaj Generic drug: Semaglutide-Weight Management   zolpidem 5 MG tablet Commonly known  as: AMBIEN TAKE 1 TABLET BY MOUTH NIGHTLY AS NEEDED FOR SLEEP        Allergies:  Allergies  Allergen Reactions   Ace Inhibitors Other (See Comments)   Corticosteroids Other (See Comments)    Eye condition states may or may not be the cause Eye condition states may or may not be the cause    Other Other (See Comments)    AVOID STEROIDS DUE TO PREVIOUS EYE CONDITION  BIRTH CONTROL PILLS   Penicillins Other (See Comments)   Sulfa Antibiotics Other (See Comments)    Other reaction(s): Unknown Other reaction(s): Unknown   Tetracycline Other (See Comments)   Tetracyclines & Related     Other reaction(s): Unknown    Family History: Family History  Problem Relation Age of Onset   Hypertension Mother    CAD Mother    COPD Mother    Bowel Disease Father    Stroke Father    Liver disease Sister    Bladder Cancer Neg Hx    Kidney cancer Neg Hx    Prostate cancer Neg Hx    Breast cancer Neg Hx     Social History:  reports that she has never smoked. She has never used smokeless tobacco. She reports current alcohol use of about 2.0 standard drinks of alcohol per week. She reports that she does not use drugs.   Physical Exam: BP (!) 155/80   Pulse 63   Ht 5' 5 (1.651 m)   Wt 280 lb (127 kg)   BMI 46.59 kg/m   Constitutional:  Well nourished. Alert and oriented, No acute distress. HEENT: [place] AT, moist mucus membranes.  Trachea midline Cardiovascular: No clubbing, cyanosis, or edema. Respiratory: Normal respiratory effort, no increased work of breathing. Neurologic: Grossly intact, no focal deficits, moving all 4 extremities. Psychiatric: Normal mood and affect.    Laboratory Data: See EPIC and HPI I have reviewed the labs.  See HPI.     Pertinent Imaging:  11/19/23 13:37  Scan Result 0ml    Assessment & Plan:    1. OAB wet -not at goal with Myrbetriq  50 mg daily -Continue Myrbetriq  50 mg daily -she is not certain how much fluid she is consuming during the day,  although she feels like it is a lot - She is also aware that drinking diet sodas and sugary juices are not conducive to good bladder control and wants to switch to drinking more water - I will have her do a voiding diary for the next week measuring input and output - She will send me the results via MyChart for my review   Return in about 1 week (around 11/26/2023) for Voiding diary .  CLOTILDA HELON RIGGERS   [facility] [address], [address] [place], [place] [address] [phone]

## 2023-11-19 ENCOUNTER — Ambulatory Visit: Payer: Self-pay | Admitting: Urology

## 2023-11-19 ENCOUNTER — Encounter: Payer: Self-pay | Admitting: Urology

## 2023-11-19 VITALS — BP 155/80 | HR 63 | Ht 65.0 in | Wt 280.0 lb

## 2023-11-19 DIAGNOSIS — R32 Unspecified urinary incontinence: Secondary | ICD-10-CM | POA: Diagnosis not present

## 2023-11-19 DIAGNOSIS — N3281 Overactive bladder: Secondary | ICD-10-CM | POA: Diagnosis not present

## 2023-11-19 LAB — BLADDER SCAN AMB NON-IMAGING

## 2023-11-19 MED ORDER — MIRABEGRON ER 50 MG PO TB24: 50.0000 mg | ORAL_TABLET | Freq: Every day | ORAL | 3 refills | Status: DC

## 2024-01-01 ENCOUNTER — Ambulatory Visit: Admitting: Physician Assistant

## 2024-01-13 ENCOUNTER — Other Ambulatory Visit: Payer: Self-pay | Admitting: Physician Assistant

## 2024-01-13 DIAGNOSIS — Z1231 Encounter for screening mammogram for malignant neoplasm of breast: Secondary | ICD-10-CM

## 2024-01-13 NOTE — Progress Notes (Signed)
 Chief Complaint  Patient presents with  . Annual Exam  . Urine frequency     Urine frequency and urgency that has worsened.     Subjective:   Lisa Barry is a 58 y.o. female here for a health maintenance visit.  Patient is an established pt Additional concerns addressed today:   HPI History of Present Illness Lisa Barry is a 58 year old female who presents for follow-up and management of her diabetes and weight loss.  Humeral shaft fracture and functional status - Sustained a fracture to the shaft of the humerus on May 7th after a fall - Did not undergo surgery due to fracture alignment and increased bleeding risk from Factor V Leiden mutation - Currently undergoing physical therapy to regain range of motion - Able to perform daily activities with some limitations, such as avoiding heavy lifting - Has returned to work and is able to drive  Glycemic control and weight management - Elevated hemoglobin A1c, currently at 6.1 - Previously took [place] for weight loss but discontinued due to severe nausea and vomiting - Recently started Zepbound through a [employer] clinical plan, beginning with the lowest dose and experiencing mild nausea - Follows a Weight Watchers diet, counting points - Engages in walking at least three times a week  Urinary frequency and urinary tract symptoms - Experiences increased urination - Previously prescribed Macrobid  for urinary symptoms - Requests antibiotic [facility]  Lipid management and laboratory findings - Cholesterol well-managed with atorvastatin - Metabolic panel normal except for slightly elevated alkaline phosphatase - Low white blood cell count, consistent over time   Patient Active Problem List  Diagnosis  . Obesity  . HTN (hypertension)  . Allergic rhinitis  . Venous thrombosis  . IBS (irritable bowel syndrome)  . Factor V Leiden (CMS-HCC)  . Morbid obesity with BMI of 40.0-44.9 (HCC)  . Subarachnoid  hemorrhage (CMS/HHS-HCC)  . Hydrocephalus, communicating (CMS/HHS-HCC)  . IIH (idiopathic intracranial hypertension)  . Hemorrhage intraabdominal  . Varicose veins of leg with pain, bilateral  . History of idiopathic intracranial hypertension  . Other optic atrophy, right eye  . 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 Essential hypertension  . Cervical radiculopathy  . Closed fracture of shaft of humerus  . Neck pain  . Osteoarthritis of left knee    Current Outpatient Medications:  .  acetaminophen  (TYLENOL ) 325 MG tablet, SMARTSIG:3 Tablet(s) By Mouth Every 6 Hours, Disp: , Rfl:  .  ALPRAZolam (XANAX) 0.5 MG tablet, Take 1 tablet (0.5 mg total) by mouth once daily as needed for Anxiety, Disp: 30 tablet, Rfl: 2 .  amLODIPine (NORVASC) 10 MG tablet, TAKE 1 TABLET BY MOUTH ONCE  DAILY, Disp: 90 tablet, Rfl: 3 .  amoxicillin (AMOXIL) 500 MG capsule, TAKE 4 CAPSULES BY MOUTH 1 HOUR PRIOR TO DENTAL TREATMENT, Disp: , Rfl:  .  atorvastatin (LIPITOR) 80 MG tablet, TAKE 1 TABLET BY MOUTH AT NIGHT, Disp: 90 tablet, Rfl: 2 .  cetirizine (ZYRTEC) 10 mg capsule, Take 10 mg by mouth once daily, Disp: , Rfl:  .  citalopram  (CELEXA ) 20 MG tablet, TAKE 1 AND 1/2 TABLETS BY MOUTH  ONCE DAILY (Patient taking differently: Take 20 mg by mouth once daily), Disp: 135 tablet, Rfl: 1 .  ELIQUIS  2.5 mg tablet, TAKE 1 TABLET BY MOUTH EVERY 12  HOURS, Disp: 180 tablet, Rfl: 3 .  FUROsemide (LASIX) 20 MG tablet, TAKE 1 TABLET BY MOUTH ONCE DAILY AS NEEDED,  Disp: 90 tablet, Rfl: 1 .  hyoscyamine (LEVBID) 0.375 mg ER tablet, Take 2 tablets (0.75 mg total) by mouth every 12 (twelve) hours as needed for Cramping, Disp: 180 tablet, Rfl: 3 .  ketoconazole  (NIZORAL ) 2 % shampoo, Apply topically, Disp: , Rfl:  .  metoprolol succinate (TOPROL-XL) 25 MG XL tablet, TAKE 1 TABLET BY MOUTH ONCE  DAILY, Disp: 90 tablet, Rfl: 3 .  mirabegron  (MYRBETRIQ ) 50 mg ER tablet, Take 50 mg by mouth once daily, Disp: , Rfl:  .   nitrofurantoin , macrocrystal-monohydrate, (MACROBID ) 100 MG capsule, Take 1 capsule (100 mg total) by mouth 2 (two) times daily for 7 days, Disp: 14 capsule, Rfl: 0 .  ondansetron  (ZOFRAN ) 4 MG tablet, Take 1 tablet (4 mg total) by mouth every 8 (eight) hours as needed for Nausea, Disp: 90 tablet, Rfl: 0 .  potassium chloride (K-DUR, KLOR-CON) 10 mEq ER tablet, TAKE 1 TABLET BY MOUTH ONCE DAILY, Disp: 90 tablet, Rfl: 1 .  valACYclovir  (VALTREX ) 500 MG tablet, TAKE 1 TABLET BY MOUTH TWICE  DAILY AS NEEDED, Disp: 180 tablet, Rfl: 1 .  valsartan-hydroCHLOROthiazide (DIOVAN-HCT) 320-12.5 mg tablet, Take 1 tablet by mouth once daily, Disp: 90 tablet, Rfl: 2 .  ZEPBOUND 2.5 mg/0.5 mL pen injector, INJECT 2.5MG  SUBCUTANEOUSLY EVERY WEEK, Disp: , Rfl:  .  zolpidem (AMBIEN) 5 MG tablet, Take 1 tablet (5 mg total) by mouth at bedtime as needed for Sleep, Disp: 15 tablet, Rfl: 1 .  cyclobenzaprine (FLEXERIL) 5 MG tablet, Take 1 tablet (5 mg total) by mouth 3 (three) times daily as needed for Muscle spasms (Patient not taking: Reported on 01/13/2024), Disp: 30 tablet, Rfl: 0       01/13/2024  [employer] Authorization for Release of Information - [employer]   Are any of your needs urgent? No  Would you like help with any of the needs that you have identified? No    Social History   Tobacco Use  . Smoking status: Never  . Smokeless tobacco: Never  Vaping Use  . Vaping status: Never Used  Substance Use Topics  . Alcohol use: Not Currently    Alcohol/week: 0.0 standard drinks of alcohol    Comment: rarely  . Drug use: No   Other Health Habits: Exercise: 3 times/week on average Current exercise activities: walking Diet: in general, a healthy diet   Dental Exam: up to date Eye Exam: up to date  GYN: Sexual Health LMP: No LMP recorded (lmp unknown). Patient has had a hysterectomy. Patient has no period. Menopausal or had hysterectomy Last pap smear: see HM section History of abnormal pap smears: Yes,  endometriosis Sexually active: Yes with female partner Current contraception: post menopausal status  Health Maintenance: See under health Maintenance activity for review of completion dates as well. Immunization History  Administered Date(s) Administered  . COVID-19 Pfizer Monovalent Vaccine 07/15/2019, 08/05/2019, 02/20/2020  . Covid-19 Vaccine 34mcg/0.5ml (>=68yrs)Moderna 05/10/2023  . Flu Vaccine MDCK IIV3, IM PF, (9MO+)(Flucelvax) 03/06/2023  . Influenza IIV4, IM PF (3 Yr+) (AFLURIA QUAD) 02/09/2020  . Influenza IIV4, cell derived (Egg-Free) PF (6 mo+) (Flucelvax QUAD) 03/11/2016, 02/10/2018, 03/16/2019, 05/26/2022  . Influenza TIV (IM) 03/15/2010  . Influenza, IM unspecified 03/23/2017  . RZV(>=58YR -OR-19+YRS IF  IMMCOMP) VACCINE (SHINGRIX) 09/22/2020, 12/31/2020  . TDAP (>=22YR) VACCINE (ADACEL/BOOSTRIX) 07/13/2014    Health Maintenance Topics with due status: Overdue     Topic Date Due   Pneumococcal Vaccine: 50+ Never done   Health Maintenance Topics with due status: Due  On     Topic Date Due   Colorectal Cancer Screening 12/31/2023   Annual Physical/Well Child Check 01/01/2024   Influenza Vaccine 01/11/2024   Health Maintenance Topics with due status: Due Soon     Topic Date Due   Mammogram 02/16/2024   Health Maintenance Topics with due status: Not Due     Topic Last Completion Date   Adult Tetanus (Td And Tdap) 07/13/2014   Creatinine Level 01/06/2024   Potassium Level 01/06/2024   Lipid Panel 01/06/2024   Serum Calcium 01/06/2024   Diabetes Screening 01/06/2024   Depression Screening 01/13/2024   Health Maintenance Topics with due status: Completed     Topic Last Completion Date   Shingrix 12/31/2020   COVID-19 Vaccine 05/10/2023   Health Maintenance Topics with due status: Addressed     Topic Date Due   HIV Screen Addressed   Health Maintenance Topics with due status: Aged Out     Topic Date Due   Hib Vaccines Aged Out   Hepatitis A Vaccines  Aged Out   Meningococcal B Vaccine Aged Out   Meningococcal ACWY Vaccine Aged Out   HPV Vaccines Aged Out   Health Maintenance Topics with due status: Discontinued     Topic Date Due   Pap Smear Discontinued   Hepatitis C Screen Discontinued    Depression Screen-PHQ2/9 PHQ-2 PHQ-2 Over the last 2 weeks, how often have you been bothered by any of the following problems? Little interest or pleasure in doing things: Not at all Feeling down, depressed, or hopeless: Not at all Patient Health Questionnaire-2 Score: 0  PHQ-9 (if PHQ >=3)    PHQ-2 Interpretation Values between 0-3 are considered not significant for depression  PHQ-9 Interpretation and Treatment Recommendations:  0-4= None  5-9= Mild / Treatment: Support, educate to call if worse; return in one month  10-14= Moderate / Treatment: Support, watchful waiting; Antidepressant or Psychotherapy  15-19= Moderately severe / Treatment: Antidepressant OR Psychotherapy  >= 20 = Major depression, severe / Antidepressant AND Psychotherapy  ROS   Objective:   Vitals:   01/13/24 1115  BP: 122/76  Pulse: 85  SpO2: 95%  Weight: (!) 138.9 kg (306 lb 3.2 oz)  Height: 165.1 cm (5' 5)  PainSc: 0-No pain   Body mass index is 50.95 kg/m. Home vitals:     Physical Exam EXTREMITIES: Mild ankle swelling. Physical Exam Wt Readings from Last 3 Encounters:  01/13/24 (!) 138.9 kg (306 lb 3.2 oz)  07/24/23 (!) 135.4 kg (298 lb 9.6 oz)  07/06/23 (!) 130.2 kg (287 lb)   Constitutional: alert and in NAD Conversation: normal and appropriate HEENT: EOMI, external ear canals normal, tympanic membranes normal, and pupils equal and round Skin: normal Oropharynx: mucous membranes moist Neck: supple and no thyroid enlargement or cervical adenopathy Respiratory: clear to auscultation, without rales or wheezes Cardiovascular: regular rate and rhythm and without murmurs, rubs or gallops Abdomen: soft, nontender, nondistended, and no  hepatosplenomegaly Musculoskeletal: age appropriate ROM of shoulders, hips and knees Extremities: lower extremity edema moderate, dorsalis pedis pulses normal, dorsalis pedis pulses diminished Neurological: grossly intact and normal gait  PE Chaperone note:  Breast Exam: Breast exam was normal; normal appearance, no masses or significant tenderness Pelvic Exam: Pelvic exam was deferred  Results Appointment on 01/06/2024  Component Date Value Ref Range Status  . WBC (White Blood Cell Count) 01/06/2024 3.7 (L)  4.1 - 10.2 10^3/uL Final  . RBC (Red Blood Cell Count) 01/06/2024 4.47  4.04 -  5.48 10^6/uL Final  . Hemoglobin 01/06/2024 12.6  12.0 - 15.0 gm/dL Final  . Hematocrit 91/72/7974 39.0  35.0 - 47.0 % Final  . MCV (Mean Corpuscular Volume) 01/06/2024 87.2  80.0 - 100.0 fl Final  . MCH (Mean Corpuscular Hemoglobin) 01/06/2024 28.2  27.0 - 31.2 pg Final  . MCHC (Mean Corpuscular Hemoglobin * 01/06/2024 32.3  32.0 - 36.0 gm/dL Final  . Platelet Count 01/06/2024 204  150 - 450 10^3/uL Final  . RDW-CV (Red Cell Distribution Widt* 01/06/2024 14.4  11.6 - 14.8 % Final  . MPV (Mean Platelet Volume) 01/06/2024 10.3  9.4 - 12.4 fl Final  . Neutrophils 01/06/2024 2.08  1.50 - 7.80 10^3/uL Final  . Lymphocytes 01/06/2024 1.17  1.00 - 3.60 10^3/uL Final  . Monocytes 01/06/2024 0.37  0.00 - 1.50 10^3/uL Final  . Eosinophils 01/06/2024 0.04  0.00 - 0.55 10^3/uL Final  . Basophils 01/06/2024 0.02  0.00 - 0.09 10^3/uL Final  . Neutrophil % 01/06/2024 56.3  32.0 - 70.0 % Final  . Lymphocyte % 01/06/2024 31.6  10.0 - 50.0 % Final  . Monocyte % 01/06/2024 10.0  4.0 - 13.0 % Final  . Eosinophil % 01/06/2024 1.1  1.0 - 5.0 % Final  . Basophil% 01/06/2024 0.5  0.0 - 2.0 % Final  . Immature Granulocyte % 01/06/2024 0.5  <=0.7 % Final  . Immature Granulocyte Count 01/06/2024 0.02  <=0.06 10^3/L Final  . Glucose 01/06/2024 131 (H)  70 - 110 mg/dL Final  . Sodium 91/72/7974 142  136 - 145 mmol/L Final  .  Potassium 01/06/2024 4.0  3.6 - 5.1 mmol/L Final  . Chloride 01/06/2024 107  97 - 109 mmol/L Final  . Carbon Dioxide (CO2) 01/06/2024 24.9  22.0 - 32.0 mmol/L Final  . Urea Nitrogen (BUN) 01/06/2024 17  7 - 25 mg/dL Final  . Creatinine 91/72/7974 0.7  0.6 - 1.1 mg/dL Final  . Glomerular Filtration Rate (eGFR) 01/06/2024 100  >60 mL/min/1.73sq m Final  . Calcium 01/06/2024 9.2  8.7 - 10.3 mg/dL Final  . AST  91/72/7974 19  8 - 39 U/L Final  . ALT  01/06/2024 30  5 - 38 U/L Final  . Alk Phos (alkaline Phosphatase) 01/06/2024 122 (H)  34 - 104 U/L Final  . Albumin 01/06/2024 4.2  3.5 - 4.8 g/dL Final  . Bilirubin, Total 01/06/2024 0.7  0.3 - 1.2 mg/dL Final  . Protein, Total 01/06/2024 6.3  6.1 - 7.9 g/dL Final  . A/G Ratio 91/72/7974 2.0  1.0 - 5.0 gm/dL Final  . Cholesterol, Total 01/06/2024 145  100 - 200 mg/dL Final  . Triglyceride 91/72/7974 96  35 - 199 mg/dL Final  . HDL (High Density Lipoprotein) Cho* 01/06/2024 66.3  35.0 - 85.0 mg/dL Final  . LDL Calculated 01/06/2024 60  0 - 130 mg/dL Final  . VLDL Cholesterol 01/06/2024 19  mg/dL Final  . Cholesterol/HDL Ratio 01/06/2024 2.2   Final  . Color 01/06/2024 Yellow  Colorless, Straw, Light Yellow, Yellow, Dark Yellow Final  . Clarity 01/06/2024 Clear  Clear Final  . Specific Gravity 01/06/2024 1.029  1.005 - 1.030 Final  . pH, Urine 01/06/2024 6.0  5.0 - 8.0 Final  . Protein, Urinalysis 01/06/2024 Negative  Negative mg/dL Final  . Glucose, Urinalysis 01/06/2024 Negative  Negative mg/dL Final  . Ketones, Urinalysis 01/06/2024 Negative  Negative mg/dL Final  . Blood, Urinalysis 01/06/2024 Negative  Negative Final  . Nitrite, Urinalysis 01/06/2024 Negative  Negative Final  .  Leukocyte Esterase, Urinalysis 01/06/2024 2+ (!)  Negative Final  . Bilirubin, Urinalysis 01/06/2024 Negative  Negative Final  . Urobilinogen, Urinalysis 01/06/2024 0.2  0.2 - 1.0 mg/dL Final  . WBC, UA 91/72/7974 5  <=5 /hpf Final  . Red Blood Cells, Urinalysis  01/06/2024 1  <=3 /hpf Final  . Bacteria, Urinalysis 01/06/2024 0-5  0 - 5 /hpf Final  . Squamous Epithelial Cells, Urinaly* 01/06/2024 5  /hpf Final  . Hemoglobin A1C 01/06/2024 6.1 (H)  4.2 - 5.6 % Final  . Average Blood Glucose (Calc) 01/06/2024 128  mg/dL Final      LABS Hemoglobin A1c: 6.1% Total Cholesterol: 145 mg/dL Alkaline Phosphatase: 877 U/L White Blood Cell Count (WBC): 3.7 x 10^3/L Urinalysis: Leukocyte esterase present  Assessment/Plan:   Patient was seen for a health maintenance exam.  Counseled the patient on health maintenance issues. Reviewed her health maintenance schedule and ordered appropriate tests. Counseled on regular exercise and weight management. Recommend regular eye exams and dental cleaning.   The following issues were addressed today for health maintenance: Bone density recommended.  Colon cancer screening options reviewed Diet - reviewed healthy diet and weight management Diet - discussed weight loss strategies Discussed importance of aerobic exercise and strength training Dermatologist skin exam recommended Encouraged to increase or continue intake of vitamin D (goal 1000-2000 Int Units/day) Encouraged to schedule routine dental follow up Encouraged to schedule routine eye check Follow up for annual exam in one year Recommended sunscreen usage Women's Health - completed breast exam  The patient's BMI is above the acceptable range; discussed or provided materials on diet/exercise  Assessment & Plan Right humeral shaft fracture, healing Healing with good alignment. Surgery avoided due to Factor V Leiden. Undergoing physical therapy. - Continue physical therapy to improve range of motion. - Avoid lifting heavy objects.  Urinary frequency with possible urinary tract infection Increased urinary frequency with leukocyte esterase present, indicating possible UTI. Requested antibiotic treatment. - Prescribe Macrobid  [facility].  Prediabetes A1c increased to 6.1, indicating prediabetes. Switched to Zepbound, experiencing mild nausea. Requires close monitoring. - Monitor A1c and repeat in three months. - Continue Zepbound and monitor for side effects. - Follow Weight Watchers diet plan.  Obesity, class 3 Managed with Weight Watchers and Zepbound. Engaging in walking exercise. Encouraged to increase physical activity. - Continue Weight Watchers program. - Increase walking to four times a week if possible.  Hyperlipidemia, on statin therapy Cholesterol well-controlled with atorvastatin. Total cholesterol is 145. - Continue atorvastatin therapy.  Lower extremity edema, likely venous Intermittent edema likely due to venous insufficiency. Reports occasional ankle swelling.  General Health Maintenance Routine health maintenance up to date. Mammogram ordered. Due for colonoscopy and flu shot. Pneumonia vaccine recommended. Shingles vaccine completed. COVID-19 vaccine updates pending. - Schedule colonoscopy. - Receive flu shot in the fall. - Consider pneumonia vaccine. - Stay updated on COVID-19 vaccine recommendations. Diagnoses and all orders for this visit:  Annual physical exam -     Depression Screen -(PHQ- 2/9, BDI)  Essential hypertension  Class 3 severe obesity due to excess calories without serious comorbidity with body mass index (BMI) of 50.0 to 59.9 in adult  Primary osteoarthritis of knee, unspecified laterality  Factor V Leiden (CMS/HHS-HCC)  Pseudotumor cerebri  Prediabetes -     Comprehensive Metabolic Panel (CMP); Future -     Hemoglobin A1C; Future  Closed fracture of shaft of right humerus with routine healing, unspecified fracture morphology, subsequent encounter  Screening for  colorectal cancer -     Ambulatory Referral to Colonoscopy [[id number]]  Encounter for screening mammogram for malignant neoplasm of breast -     Mammo screening digital bilateral;  Future  Screening for depression -     Depression Screen -(PHQ- 2/9, BDI)  Other orders -     Follow up in Primary Care; Future -     Follow up in Primary Care -     nitrofurantoin , macrocrystal-monohydrate, (MACROBID ) 100 MG capsule; Take 1 capsule (100 mg total) by mouth 2 (two) times daily for 7 days    Plan:   PE performed. Health maintenance reviewed and updated. Reviewed labs. Continue follow up with specialists as need.   This visit was coded based on medical decision making (MDM).       Follow-up     Normal Orders This Visit   Follow up in Primary Care [[id number] Custom]    Questions:   Does this order need to be coordinated with another visit or should it be hidden from the patient portal? If yes to either, the patient will need to stop by the front desk or call to schedule.: No   Who is this follow-up with?: Me   Can this appointment be overbooked by a scheduler?: No   What type of follow up is needed?: Physical   What's the reason for follow up?:     Future Labs/Procedures Expected by Expires   Follow up in Primary Care [[id number] Custom]  04/13/2024 01/12/2025   Questions:     Does this order need to be coordinated with another visit or should it be hidden from the patient portal? If yes to either, the patient will need to stop by the front desk or call to schedule.: Yes   Who is this follow-up with?: PCP   What type of follow up is needed?: Office Visit   What's the reason for follow up?: Chronic Medical Problems   Allow telemedicine?: In Person Only         Future Appointments   This patient does not currently have any appointments scheduled.     An after visit summary was provided for the patient either in written format or through MyChart.  This note has been created using automated tools and reviewed for accuracy by MIRIAM KLEM MCLAUGHLIN.  Eva Crimes, PA-C

## 2024-01-18 ENCOUNTER — Ambulatory Visit: Admitting: Physician Assistant

## 2024-01-22 ENCOUNTER — Ambulatory Visit (INDEPENDENT_AMBULATORY_CARE_PROVIDER_SITE_OTHER): Admitting: Physician Assistant

## 2024-01-22 ENCOUNTER — Encounter: Payer: Self-pay | Admitting: Physician Assistant

## 2024-01-22 VITALS — BP 113/81 | HR 87 | Ht 65.0 in | Wt 299.6 lb

## 2024-01-22 DIAGNOSIS — M545 Low back pain, unspecified: Secondary | ICD-10-CM | POA: Diagnosis not present

## 2024-01-22 DIAGNOSIS — N3281 Overactive bladder: Secondary | ICD-10-CM | POA: Diagnosis not present

## 2024-01-22 DIAGNOSIS — R8290 Unspecified abnormal findings in urine: Secondary | ICD-10-CM | POA: Diagnosis not present

## 2024-01-22 LAB — URINALYSIS, COMPLETE
Bilirubin, UA: NEGATIVE
Glucose, UA: NEGATIVE
Ketones, UA: NEGATIVE
Leukocytes,UA: NEGATIVE
Nitrite, UA: NEGATIVE
Protein,UA: NEGATIVE
RBC, UA: NEGATIVE
Specific Gravity, UA: 1.025 (ref 1.005–1.030)
Urobilinogen, Ur: 0.2 mg/dL (ref 0.2–1.0)
pH, UA: 6 (ref 5.0–7.5)

## 2024-01-22 LAB — BLADDER SCAN AMB NON-IMAGING

## 2024-01-22 LAB — MICROSCOPIC EXAMINATION: Epithelial Cells (non renal): >10 /HPF — AB (ref 0–10)

## 2024-01-22 NOTE — Progress Notes (Signed)
 01/22/2024 9:39 AM   Lisa Barry Sep 02, 1965 [id number]  CC: Chief Complaint  Patient presents with   Urinary Frequency   HPI: Lisa Barry is a 58 y.o. female with PMH hydrocephalus, factor V Leiden on Eliquis , and OAB with Myrbetriq  50 mg who presents today for evaluation of possible UTI.   She saw Lisa Barry in clinic on 11/19/2023.  She was not yet at treatment goal with Myrbetriq  50 mg and was asked to start a voiding diary.  Today she reports 1 week of right low back pain, frequency, and malodorous urine.  She denies dysuria, fever, chills, nausea, vomiting, or a history of nephrolithiasis.  Her PCP treated her for possible UTI with Macrobid  100 mg twice daily x 5 days, which she finished yesterday.  If anything, she thinks the odor may be worse now.  She wonders if her chronic frequency and urge incontinence are consistent with OAB.  She has held off on doing a voiding diary, because she understands that she would need to cut back on diet Dr. Nunzio to complete this, but she has not yet made that change.  She had an episode of acute right flank pain in February 2021.  CT stone study at that time showed degenerative changes of the spine with no urolithiasis or hydronephrosis.  In-office UA today pan negative; urine microscopy with >10 epithelial cells/hpf.   PMH: Past Medical History:  Diagnosis Date   Blood clot associated with vein wall inflammation 2008   Factor V Leiden (HCC) 2008   Hypertension    TIA (transient ischemic attack) 11/2018    Surgical History: Past Surgical History:  Procedure Laterality Date   ABDOMINAL HYSTERECTOMY     BACK SURGERY     CHOLECYSTECTOMY     LUMBAR PERITONEAL SHUNT  2000    Home Medications:  Allergies as of 01/22/2024       Reactions   Ace Inhibitors Other (See Comments)   Corticosteroids Other (See Comments)   Eye condition states may or may not be the cause Eye condition states may or may not be the cause   Other Other (See  Comments)   AVOID STEROIDS DUE TO PREVIOUS EYE CONDITION BIRTH CONTROL PILLS   Penicillins Other (See Comments)   Sulfa Antibiotics Other (See Comments)   Other reaction(s): Unknown Other reaction(s): Unknown   Tetracycline Other (See Comments)   Tetracyclines & Related    Other reaction(s): Unknown        Medication List        Accurate as of January 22, 2024  9:39 AM. If you have any questions, ask your nurse or doctor.          acyclovir  ointment 5 % Commonly known as: Zovirax  Apply to aa's QID   ALPRAZolam 0.5 MG tablet Commonly known as: XANAX Take 0.5 mg by mouth daily as needed.   amLODipine 10 MG tablet Commonly known as: NORVASC Take 5 mg by mouth.   atorvastatin 80 MG tablet Commonly known as: LIPITOR Take by mouth.   citalopram  20 MG tablet Commonly known as: CELEXA  Take by mouth.   Eliquis  2.5 MG Tabs tablet Generic drug: apixaban  TAKE 1 TABLET BY MOUTH TWICE  DAILY   hyoscyamine 0.375 MG 12 hr tablet Commonly known as: LEVBID   ketoconazole  2 % shampoo Commonly known as: NIZORAL  WASH SCALP 2-3 TIMES WEEKLY, LET SIT 5 MINUTES BEFORE RINSING OUT, AS DIRECTED   metoprolol succinate 25 MG 24 hr tablet Commonly known as:  TOPROL-XL TAKE 1 TABLET BY MOUTH EVERY DAY   mirabegron  ER 50 MG Tb24 tablet Commonly known as: MYRBETRIQ  Take 1 tablet (50 mg total) by mouth daily.   ondansetron  4 MG tablet Commonly known as: ZOFRAN  Take 4 mg by mouth every 8 (eight) hours as needed.   valACYclovir  500 MG tablet Commonly known as: VALTREX  Take by mouth.   valsartan-hydrochlorothiazide 320-12.5 MG tablet Commonly known as: DIOVAN-HCT Take by mouth.   zolpidem 5 MG tablet Commonly known as: AMBIEN TAKE 1 TABLET BY MOUTH NIGHTLY AS NEEDED FOR SLEEP What changed:  how much to take how to take this reasons to take this        Allergies:  Allergies  Allergen Reactions   Ace Inhibitors Other (See Comments)   Corticosteroids Other (See  Comments)    Eye condition states may or may not be the cause Eye condition states may or may not be the cause    Other Other (See Comments)    AVOID STEROIDS DUE TO PREVIOUS EYE CONDITION  BIRTH CONTROL PILLS   Penicillins Other (See Comments)   Sulfa Antibiotics Other (See Comments)    Other reaction(s): Unknown Other reaction(s): Unknown   Tetracycline Other (See Comments)   Tetracyclines & Related     Other reaction(s): Unknown    Family History: Family History  Problem Relation Age of Onset   Hypertension Mother    CAD Mother    COPD Mother    Bowel Disease Father    Stroke Father    Liver disease Sister    Bladder Cancer Neg Hx    Kidney cancer Neg Hx    Prostate cancer Neg Hx    Breast cancer Neg Hx     Social History:   reports that she has never smoked. She has never used smokeless tobacco. She reports current alcohol use of about 2.0 standard drinks of alcohol per week. She reports that she does not use drugs.  Physical Exam: BP 113/81   Pulse 87   Ht 5' 5 (1.651 m)   Wt 299 lb 9.6 oz (135.9 kg)   BMI 49.86 kg/m   Constitutional:  Alert and oriented, no acute distress, nontoxic appearing HEENT: [place], AT Cardiovascular: No clubbing, cyanosis, or edema Respiratory: Normal respiratory effort, no increased work of breathing Skin: No rashes, bruises or suspicious lesions Neurologic: Grossly intact, no focal deficits, moving all 4 extremities Psychiatric: Normal mood and affect  Laboratory Data: Results for orders placed or performed in visit on 01/22/24  Microscopic Examination   Collection Time: 01/22/24  9:11 AM   Urine  Result Value Ref Range   WBC, UA 0-5 0 - 5 /hpf   RBC, Urine 0-2 0 - 2 /hpf   Epithelial Cells (non renal) >10 (A) 0 - 10 /hpf   Mucus, UA Present (A) Not Estab.   Bacteria, UA Few None seen/Few  Urinalysis, Complete   Collection Time: 01/22/24  9:11 AM  Result Value Ref Range   Specific Gravity, UA 1.025 1.005 - 1.030   pH, UA  6.0 5.0 - 7.5   Color, UA Yellow Yellow   Appearance Ur Clear Clear   Leukocytes,UA Negative Negative   Protein,UA Negative Negative/Trace   Glucose, UA Negative Negative   Ketones, UA Negative Negative   RBC, UA Negative Negative   Bilirubin, UA Negative Negative   Urobilinogen, Ur 0.2 0.2 - 1.0 mg/dL   Nitrite, UA Negative Negative   Microscopic Examination Comment    Microscopic Examination  See below:   Bladder Scan (Post Void Residual) in office   Collection Time: 01/22/24  9:27 AM  Result Value Ref Range   Scan Result 0ml    Assessment & Plan:   1. Malodorous urine (Primary) UA bland.  Unclear if she was appropriately treated for UTI with Macrobid  or if she never had a UTI, but regardless I have low suspicion for UTI today. - Urinalysis, Complete  2. OAB (overactive bladder) Agree with OAB diagnosis.  She is emptying appropriately.  She remains on Myrbetriq  50 mg, but is not yet at treatment goal.  Agree with voiding diary.  I encouraged her to take 2 days worth of data on her normal amount of diet Dr. Nunzio, then do 2 days with limiting this so that we can determine the degree to which this is contributing to her symptoms. - Bladder Scan (Post Void Residual) in office  3. Acute right-sided low back pain, unspecified whether sciatica present Low suspicion for urolithiasis in the absence of hematuria today, though will obtain renal ultrasound for further evaluation. - US  RENAL; Future   Return for Will call with results.  Lucie Hones, PA-C  [facility] [place] [address], [address] [place], [place] [address] [phone]

## 2024-01-22 NOTE — Patient Instructions (Signed)
 Ultrasound scheduling: [phone]

## 2024-01-25 ENCOUNTER — Ambulatory Visit: Admitting: Physician Assistant

## 2024-01-29 ENCOUNTER — Ambulatory Visit
Admission: RE | Admit: 2024-01-29 | Discharge: 2024-01-29 | Disposition: A | Discharge status: Home / Self Care | Source: Ambulatory Visit | Attending: Physician Assistant | Admitting: Physician Assistant

## 2024-01-29 DIAGNOSIS — M545 Low back pain, unspecified: Secondary | ICD-10-CM | POA: Diagnosis present

## 2024-02-05 ENCOUNTER — Ambulatory Visit: Payer: Self-pay | Admitting: Physician Assistant

## 2024-02-15 ENCOUNTER — Ambulatory Visit: Payer: Managed Care, Other (non HMO) | Admitting: Dermatology

## 2024-03-09 ENCOUNTER — Encounter

## 2024-03-28 ENCOUNTER — Ambulatory Visit: Admitting: Urology

## 2024-03-28 VITALS — BP 142/88 | HR 95 | Ht 65.0 in | Wt 283.9 lb

## 2024-03-28 DIAGNOSIS — N3281 Overactive bladder: Secondary | ICD-10-CM | POA: Diagnosis not present

## 2024-03-28 DIAGNOSIS — R3989 Other symptoms and signs involving the genitourinary system: Secondary | ICD-10-CM | POA: Diagnosis not present

## 2024-03-28 LAB — MICROSCOPIC EXAMINATION: Epithelial Cells (non renal): >10 /HPF — AB (ref 0–10)

## 2024-03-28 LAB — URINALYSIS, COMPLETE
Bilirubin, UA: NEGATIVE
Glucose, UA: NEGATIVE
Ketones, UA: NEGATIVE
Leukocytes,UA: NEGATIVE
Nitrite, UA: NEGATIVE
Protein,UA: NEGATIVE
RBC, UA: NEGATIVE
Specific Gravity, UA: 1.015 (ref 1.005–1.030)
Urobilinogen, Ur: 0.2 mg/dL (ref 0.2–1.0)
pH, UA: 7 (ref 5.0–7.5)

## 2024-03-28 MED ORDER — GEMTESA 75 MG PO TABS: 1.0000 | ORAL_TABLET | Freq: Every day | ORAL | Status: DC

## 2024-03-28 MED ORDER — GEMTESA 75 MG PO TABS: 1.0000 | ORAL_TABLET | Freq: Every day | ORAL | 11 refills | Status: DC

## 2024-03-28 NOTE — Progress Notes (Signed)
 03/28/2024 2:39 PM   Lisa Barry [facility] 1965/12/15 [id number]  Referring provider: Marikay Eva POUR, PA [address] [place]  [place] [address]  No chief complaint on file.   HPI: Extenders: Hydrocephalus, factor V [place]yden on Eliquis ; on overactive bladder.  Saw Dr. Penne years ago.  Stroke in 2020.  Tried oxybutynin .  Today Patient has urge incontinence.  She can leak with coughing sneezing but not bending lifting.  Both are significant.  She has mild to moderate bedwetting  Her flow was good.  She cannot remember taking oxybutynin  and Myrbetriq  is no longer helping  She voids every 2-3 hours gets up to 3 times at night  She had a stroke in 2020.  She has a shunt for hydrocephalus.  Fluid modification does not help.  Is not a diabetic.  Bowel function normal.  Patient had grade 1 hypermobility bladder neck and negative cough test with modest cough.  Exam a little bit limited due to body habitus   PMH: Past Medical History:  Diagnosis Date   Blood clot associated with vein wall inflammation 2008   Factor V Leiden 2008   Hypertension    TIA (transient ischemic attack) 11/2018    Surgical History: Past Surgical History:  Procedure Laterality Date   ABDOMINAL HYSTERECTOMY     BACK SURGERY     CHOLECYSTECTOMY     LUMBAR PERITONEAL SHUNT  2000    Home Medications:  Allergies as of 03/28/2024       Reactions   Ace Inhibitors Other (See Comments)   Corticosteroids Other (See Comments)   Eye condition states may or may not be the cause Eye condition states may or may not be the cause   Other Other (See Comments)   AVOID STEROIDS DUE TO PREVIOUS EYE CONDITION BIRTH CONTROL PILLS   Penicillins Other (See Comments)   Sulfa Antibiotics Other (See Comments)   Other reaction(s): Unknown Other reaction(s): Unknown   Tetracycline Other (See Comments)   Tetracyclines & Related    Other reaction(s): Unknown        Medication List         Accurate as of March 28, 2024  2:39 PM. If you have any questions, ask your nurse or doctor.          acyclovir  ointment 5 % Commonly known as: Zovirax  Apply to aa's QID   ALPRAZolam 0.5 MG tablet Commonly known as: XANAX Take 0.5 mg by mouth daily as needed.   amLODipine 10 MG tablet Commonly known as: NORVASC Take 5 mg by mouth.   atorvastatin 80 MG tablet Commonly known as: LIPITOR Take by mouth.   citalopram  20 MG tablet Commonly known as: CELEXA  Take by mouth.   Eliquis  2.5 MG Tabs tablet Generic drug: apixaban  TAKE 1 TABLET BY MOUTH TWICE  DAILY   hyoscyamine 0.375 MG 12 hr tablet Commonly known as: LEVBID   ketoconazole  2 % shampoo Commonly known as: NIZORAL  WASH SCALP 2-3 TIMES WEEKLY, LET SIT 5 MINUTES BEFORE RINSING OUT, AS DIRECTED   metoprolol succinate 25 MG 24 hr tablet Commonly known as: TOPROL-XL TAKE 1 TABLET BY MOUTH EVERY DAY   mirabegron  ER 50 MG Tb24 tablet Commonly known as: MYRBETRIQ  Take 1 tablet (50 mg total) by mouth daily.   ondansetron  4 MG tablet Commonly known as: ZOFRAN  Take 4 mg by mouth every 8 (eight) hours as needed.   valACYclovir  500 MG tablet Commonly known as: VALTREX  Take by mouth.  valsartan-hydrochlorothiazide 320-12.5 MG tablet Commonly known as: DIOVAN-HCT Take by mouth.   zolpidem 5 MG tablet Commonly known as: AMBIEN TAKE 1 TABLET BY MOUTH NIGHTLY AS NEEDED FOR SLEEP What changed:  how much to take how to take this reasons to take this        Allergies:  Allergies  Allergen Reactions   Ace Inhibitors Other (See Comments)   Corticosteroids Other (See Comments)    Eye condition states may or may not be the cause Eye condition states may or may not be the cause    Other Other (See Comments)    AVOID STEROIDS DUE TO PREVIOUS EYE CONDITION  BIRTH CONTROL PILLS   Penicillins Other (See Comments)   Sulfa Antibiotics Other (See Comments)    Other reaction(s): Unknown Other  reaction(s): Unknown   Tetracycline Other (See Comments)   Tetracyclines & Related     Other reaction(s): Unknown    Family History: Family History  Problem Relation Age of Onset   Hypertension Mother    CAD Mother    COPD Mother    Bowel Disease Father    Stroke Father    Liver disease Sister    Bladder Cancer Neg Hx    Kidney cancer Neg Hx    Prostate cancer Neg Hx    Breast cancer Neg Hx     Social History:  reports that she has never smoked. She has never used smokeless tobacco. She reports current alcohol use of about 2.0 standard drinks of alcohol per week. She reports that she does not use drugs.  ROS:                                        Physical Exam: There were no vitals taken for this visit.  Constitutional:  Alert and oriented, No acute distress. HEENT: [place] AT, moist mucus membranes.  Trachea midline, no masses.   Laboratory Data: Lab Results  Component Value Date   WBC 7.2 11/21/2018   HGB 14.4 11/21/2018   HCT 43.9 11/21/2018   MCV 84.4 11/21/2018   PLT 222 11/21/2018    Lab Results  Component Value Date   CREATININE 0.60 11/21/2018    No results found for: PSA  No results found for: TESTOSTERONE  No results found for: HGBA1C  Urinalysis    Component Value Date/Time   COLORURINE YELLOW (A) 11/21/2018 1357   APPEARANCEUR Clear 01/22/2024 0911   LABSPEC 1.020 11/21/2018 1357   PHURINE 8.0 11/21/2018 1357   GLUCOSEU Negative 01/22/2024 0911   HGBUR NEGATIVE 11/21/2018 1357   BILIRUBINUR Negative 01/22/2024 0911   KETONESUR 20 (A) 11/21/2018 1357   PROTEINUR Negative 01/22/2024 0911   PROTEINUR NEGATIVE 11/21/2018 1357   NITRITE Negative 01/22/2024 0911   NITRITE NEGATIVE 11/21/2018 1357   LEUKOCYTESUR Negative 01/22/2024 0911   LEUKOCYTESUR NEGATIVE 11/21/2018 1357    Pertinent Imaging: Urine reviewed and sent for culture.  Chart reviewed  Assessment & Plan: Patient has mixed incontinence.  She has  failed 2 medications.  Role of urodynamics and cystoscopy discussed.  She has risk factors for neurogenic bladder Patient wants to try the Gemtesa samples and prescription.  She will call if she wants to go ahead with testing.  Otherwise she may just go back on the Myrbetriq .  1. OAB (overactive bladder) (Primary)  - Urinalysis, Complete   No follow-ups on file.  Glendia DELENA Elizabeth, MD  [facility] [address] [place], [place] [address] [phone]

## 2024-03-30 ENCOUNTER — Ambulatory Visit

## 2024-03-31 ENCOUNTER — Encounter: Payer: Self-pay | Admitting: Urology

## 2024-03-31 DIAGNOSIS — R32 Unspecified urinary incontinence: Secondary | ICD-10-CM

## 2024-03-31 LAB — CULTURE, URINE COMPREHENSIVE

## 2024-04-14 ENCOUNTER — Ambulatory Visit

## 2024-04-14 NOTE — Addendum Note (Signed)
 Addended byBETHA CORIE PLATER on: 04/14/2024 04:49 PM   Modules accepted: Orders

## 2024-04-15 MED ORDER — SOLIFENACIN SUCCINATE 5 MG PO TABS: 5.0000 mg | ORAL_TABLET | Freq: Every day | ORAL | 11 refills | Status: AC

## 2024-04-15 NOTE — Addendum Note (Signed)
 Addended byBETHA CORIE PLATER on: 04/15/2024 09:03 AM   Modules accepted: Orders

## 2024-04-18 NOTE — Telephone Encounter (Signed)
 Unable to complete the prior authorization through CoverMyMeds. I contacted the patient's insurance directly and initiated the prior authorization by phone. The patient's insurance instructed me to contact [employer]. After speaking with [employer], I was advised to verify coverage with [employer]. I contacted [employer], who confirmed that Gemtesa  is not covered for this patient.  The patient will be notified via MyChart regarding the need to change their medication.

## 2024-04-22 ENCOUNTER — Other Ambulatory Visit: Payer: Self-pay | Admitting: Dermatology

## 2024-04-22 DIAGNOSIS — L219 Seborrheic dermatitis, unspecified: Secondary | ICD-10-CM

## 2024-04-25 ENCOUNTER — Telehealth: Payer: Self-pay

## 2024-04-25 ENCOUNTER — Other Ambulatory Visit: Payer: Self-pay

## 2024-04-25 DIAGNOSIS — L219 Seborrheic dermatitis, unspecified: Secondary | ICD-10-CM

## 2024-04-25 MED ORDER — KETOCONAZOLE 2 % EX SHAM: MEDICATED_SHAMPOO | CUTANEOUS | 0 refills | Status: AC

## 2024-04-25 NOTE — Telephone Encounter (Signed)
 Patient called requesting a ref/ill of Ketoconazole  shampoo, last appt here was 09/17/22, pt scheduled an appt for 05/09/24

## 2024-05-02 ENCOUNTER — Encounter

## 2024-05-09 ENCOUNTER — Ambulatory Visit

## 2024-05-09 DIAGNOSIS — Z1283 Encounter for screening for malignant neoplasm of skin: Secondary | ICD-10-CM

## 2024-05-09 DIAGNOSIS — L821 Other seborrheic keratosis: Secondary | ICD-10-CM

## 2024-05-09 DIAGNOSIS — L304 Erythema intertrigo: Secondary | ICD-10-CM | POA: Diagnosis not present

## 2024-05-09 DIAGNOSIS — L219 Seborrheic dermatitis, unspecified: Secondary | ICD-10-CM

## 2024-05-09 DIAGNOSIS — D1801 Hemangioma of skin and subcutaneous tissue: Secondary | ICD-10-CM

## 2024-05-09 DIAGNOSIS — W908XXA Exposure to other nonionizing radiation, initial encounter: Secondary | ICD-10-CM

## 2024-05-09 DIAGNOSIS — L578 Other skin changes due to chronic exposure to nonionizing radiation: Secondary | ICD-10-CM

## 2024-05-09 DIAGNOSIS — L989 Disorder of the skin and subcutaneous tissue, unspecified: Secondary | ICD-10-CM

## 2024-05-09 DIAGNOSIS — L814 Other melanin hyperpigmentation: Secondary | ICD-10-CM

## 2024-05-09 DIAGNOSIS — D229 Melanocytic nevi, unspecified: Secondary | ICD-10-CM

## 2024-05-09 MED ORDER — NYSTATIN 100000 UNIT/GM EX OINT: 1.0000 | TOPICAL_OINTMENT | Freq: Two times a day (BID) | CUTANEOUS | 2 refills | Status: AC

## 2024-05-09 MED ORDER — KETOCONAZOLE 2 % EX SHAM: MEDICATED_SHAMPOO | CUTANEOUS | 5 refills | Status: AC

## 2024-05-09 MED ORDER — TRIAMCINOLONE ACETONIDE 0.1 % EX OINT: TOPICAL_OINTMENT | CUTANEOUS | 2 refills | Status: AC

## 2024-05-09 MED ORDER — CLOBETASOL PROPIONATE 0.05 % EX OINT: TOPICAL_OINTMENT | CUTANEOUS | 2 refills | Status: AC

## 2024-05-09 NOTE — Patient Instructions (Addendum)
 "  For backside we prescribed Triamcinolone ointment and Nystatin ointment to use for flares For hand we prescribed Clobetasol ointment For scalp we refilled your Ketoconazole  shampoo   Sunscreen  Who needs sunscreen? Everyone. Sunscreen use can help prevent skin cancer by protecting you from the sun's harmful ultraviolet rays. Anyone can get skin cancer, regardless of age, gender or race. In fact, it is estimated that one in five Americans will develop skin cancer in their lifetime.  Sunscreen alone cannot fully protect you. In addition to wearing sunscreen, dermatologists recommend taking the following steps to protect your skin and find skin cancer early:  Seek shade when appropriate, remembering that the sun's rays are strongest between 10 a.m. and 2 p.m. If your shadow is shorter than you are, seek shade. Dress to protect yourself from the sun by wearing a lightweight long-sleeved shirt, pants, a wide-brimmed hat and sunglasses, when possible.  Use extra caution near water, snow and sand as they reflect the damaging rays of the sun, which can increase your chance of sunburn.  Get vitamin D safely through a healthy diet that may include vitamin supplements. Don't seek the sun. Avoid tanning beds. Ultraviolet light from the sun and tanning beds can cause skin cancer and wrinkling. If you want to look tan, you may wish to use a self-tanning product, but continue to use sunscreen with it.  When should I use sunscreen? Every day you go outside--even if you're just walking to and from your form of transportation. The sun emits harmful UV rays year-round. Even on cloudy days, up to 80 percent of the sun's harmful UV rays can penetrate your skin. Snow, sand and water increase the need for sunscreen because they reflect the sun's rays.  How much sunscreen should I use, and how often should I apply it? Most people only apply 25-50 percent of the recommended amount of sunscreen. Apply enough sunscreen  to cover all exposed skin. Most adults need about 1 ounce -- or enough to fill a shot glass -- to fully cover their body.  Don't forget to apply to the tops of your feet, your neck, your ears and the top of your head. Apply sunscreen to dry skin 15 minutes before going outdoors.  Skin cancer also can form on the lips. To protect your lips, apply a lip balm or lipstick that contains sunscreen with an SPF of 30 or higher.  When outdoors, reapply sunscreen approximately every two hours, or after swimming or sweating, according to the directions on the bottle.   Broad-spectrum sunscreens protect against both UVA and UVB rays. What is the difference between the rays? Sunlight consists of two types of harmful rays that reach the earth -- UVA rays and UVB rays. Overexposure to either can lead to skin cancer. In addition to causing skin cancer, here's what each of these rays do:  UVA rays (or aging rays) can prematurely age your skin, causing wrinkles and age spots, and can pass through window glass. UVB rays (or burning rays) are the primary cause of sunburn and are blocked by window glass  There is no safe way to tan. Every time you tan, you damage your skin. As this damage builds, you speed up the aging of your skin and increase your risk for all types of skin cancer.  What is the difference between chemical and physical sunscreens? Chemical sunscreens work like a sponge, absorbing the sun's rays. They contain one or more of the following active ingredients: oxybenzone,  avobenzone, octisalate, octocrylene, homosalate and octinoxate. These formulations tend to be easier to rub into the skin without leaving a white residue.   Physical sunscreens work like a shield, sitting sit on the surface of your skin and deflecting the sun's rays. They contain the active ingredients zinc oxide and/or titanium dioxide. Use this sunscreen if you have sensitive skin.   What type of sunscreen should I use? The best type of  sunscreen is the one you will use again and again. Just make sure it offers broad-spectrum (UVA and UVB) protection, has an SPF of 30+, and is water-resistant. The kind of sunscreen you use is a matter of personal choice, and may vary depending on the area of the body to be protected. Available sunscreen options include lotions, creams, gels, ointments, wax sticks and sprays.  Recommended physical sunscreens for face: - Neutrogena Sheer Zinc - Aveeno Positively Mineral Sensitive - CeraVe Hydrating Mineral (also has a tinted version) - La Roche-Posay Anthelios Mineral Face (comes as a cream, lotion, light fluid, and there is also a tinted version).  - EltaMD UV Clear (also has a tinted version)  Recommended physical sunscreens for body: - Neutrogena Sheer Zinc Dry-Touch Sunscreen Sensitive Skin Lotion Broad Spectrum SPF 50 - Aveeno Positively Mineral Sensitive Skin Sunscreen Broad Spectrum SPF 50 - La Roche-Posay Anthelios SPF 50 Mineral Sunscreen - Gentle Lotion - CeraVe Hydrating Mineral Sunscreen SPF 50  Recommended chemical sunscreens for face: - Anthelios UV Correct Face Sunscreen SPF 70 with Niacinamide - Neutrogena Clear Face Oil-Free SPF 50 with Helioplex - Neutrogena Sport Face Oil-Free SPF 70+ with Helioplex - Aveeno Protect + Hydrate Sunscreen For Face SPF 70 - La Roche-Posay Anthelios Light Fluid Sunscreen for Face SPF 60  Recommended chemical sunscreens for body: - Neutrogena Ultra Sheer Dry-Touch Sunscreen SPF 70 - Aveeno Protect + Hydrate Broad Spectrum All-Day Hydration SPF 60 (comes in a big pump) - La Roche-Posay Anthelios Melt-In Milk Sunscreen SPF 60  Due to recent changes in healthcare laws, you may see results of your pathology and/or laboratory studies on MyChart before the doctors have had a chance to review them. We understand that in some cases there may be results that are confusing or concerning to you. Please understand that not all results are received at the  same time and often the doctors may need to interpret multiple results in order to provide you with the best plan of care or course of treatment. Therefore, we ask that you please give us  2 business days to thoroughly review all your results before contacting the office for clarification. Should we see a critical lab result, you will be contacted sooner.   If You Need Anything After Your Visit  If you have any questions or concerns for your doctor, please call our main line at [phone] and press option 4 to reach your doctor's medical assistant. If no one answers, please leave a voicemail as directed and we will return your call as soon as possible. Messages left after 4 pm will be answered the following business day.   You may also send us  a message via MyChart. We typically respond to MyChart messages within 1-2 business days.  For prescription refills, please ask your pharmacy to contact our office. Our fax number is [phone].  If you have an urgent issue when the clinic is closed that cannot wait until the next business day, you can page your doctor at the number below.    Please note that while we  do our best to be available for urgent issues outside of office hours, we are not available 24/7.   If you have an urgent issue and are unable to reach us , you may choose to seek medical care at your doctor's office, retail clinic, urgent care center, or emergency room.  If you have a medical emergency, please immediately call 911 or go to the emergency department.  Pager Numbers  - Dr. Hester: [phone]  - Dr. Jackquline: [phone]  - Dr. Claudene: [phone]   - Dr. Raymund: [phone]  In the event of inclement weather, please call our main line at [phone] for an update on the status of any delays or closures.  Dermatology Medication Tips: Please keep the boxes that topical medications come in in order to help keep track of the instructions about where and how to use  these. Pharmacies typically print the medication instructions only on the boxes and not directly on the medication tubes.   If your medication is too expensive, please contact our office at [phone] option 4 or send us  a message through MyChart.   We are unable to tell what your co-pay for medications will be in advance as this is different depending on your insurance coverage. However, we may be able to find a substitute medication at lower cost or fill out paperwork to get insurance to cover a needed medication.   If a prior authorization is required to get your medication covered by your insurance company, please allow us  1-2 business days to complete this process.  Drug prices often vary depending on where the prescription is filled and some pharmacies may offer cheaper prices.  The website www.goodrx.com contains coupons for medications through different pharmacies. The prices here do not account for what the cost may be with help from insurance (it may be cheaper with your insurance), but the website can give you the price if you did not use any insurance.  - You can print the associated coupon and take it with your prescription to the pharmacy.  - You may also stop by our office during regular business hours and pick up a GoodRx coupon card.  - If you need your prescription sent electronically to a different pharmacy, notify our office through [facility] or by phone at [phone] option 4.     Si Usted Necesita Algo Despus de Su Visita  Tambin puede enviarnos un mensaje a travs de Clinical Cytogeneticist. Por lo general respondemos a los mensajes de MyChart en el transcurso de 1 a 2 das hbiles.  Para renovar recetas, por favor pida a su farmacia que se ponga en contacto con nuestra oficina. Randi lakes de fax es [place] [phone].  Si tiene un asunto urgente cuando la clnica est cerrada y que no puede esperar hasta el siguiente da hbil, puede llamar/localizar a su doctor(a) al  nmero que aparece a continuacin.   Por favor, tenga en cuenta que aunque hacemos todo lo posible para estar disponibles para asuntos urgentes fuera del horario de [place], no estamos disponibles las 24 horas del da, los 7 [address]  [address] de la [place].   Si tiene un problema urgente y no puede comunicarse con nosotros, puede optar por buscar atencin mdica  en el consultorio de su doctor(a), en una clnica privada, en un centro de atencin urgente o en una sala de emergencias.  Si tiene engineer, drilling, por favor llame inmediatamente al 911 o vaya a la sala de emergencias.  Nmeros de bper  - Dr.  Hester: [phone]  - Dra. Jackquline: [phone]  - Dr. Claudene: [phone]  - Dra. Kitts: [phone]  En caso de inclemencias del [place], por favor llame a nuestra lnea principal al [phone] para una actualizacin sobre el estado de cualquier retraso o cierre.  Consejos para la medicacin en dermatologa: Por favor, guarde las cajas en las que vienen los medicamentos de uso tpico para ayudarle a seguir las instrucciones sobre dnde y cmo usarlos. Las farmacias generalmente imprimen las instrucciones del medicamento slo en las cajas y no directamente en los tubos del [place].   Si su medicamento es muy caro, por favor, pngase en contacto con landry rieger llamando al [phone] y presione la opcin 4 o envenos un mensaje a travs de Clinical Cytogeneticist.   No podemos decirle cul ser su copago por los medicamentos por adelantado ya que esto es diferente dependiendo de la cobertura de su seguro. Sin embargo, es posible que podamos encontrar un medicamento sustituto a audiological scientist un formulario para que el seguro cubra el medicamento que se considera necesario.   Si se requiere una autorizacin previa para que su compaa de seguros cubra su medicamento, por favor permtanos de 1 a 2 das hbiles para completar este proceso.  Los precios de los medicamentos varan con frecuencia  dependiendo del environmental consultant de dnde se surte la receta y alguna farmacias pueden ofrecer precios ms baratos.  El sitio web www.goodrx.com tiene cupones para medicamentos de health and safety inspector. Los precios aqu no tienen en cuenta lo que podra costar con la ayuda del seguro (puede ser ms barato con su seguro), pero el sitio web puede darle el precio si no utiliz tourist information centre manager.  - Puede imprimir el cupn correspondiente y llevarlo con su receta a la farmacia.  - Tambin puede pasar por nuestra oficina durante el horario de atencin regular y education officer, museum una tarjeta de cupones de GoodRx.  - Si necesita que su receta se enve electrnicamente a una farmacia diferente, informe a nuestra oficina a travs de MyChart de [place] o por telfono llamando al [phone] y presione la opcin 4.  "

## 2024-05-09 NOTE — Progress Notes (Signed)
 "   Subjective   Lisa Barry is a 58 y.o. female who presents for the following: Total body skin exam for skin cancer screening and mole check. The patient has spots, moles and lesions to be evaluated, some may be new or changing and the patient may have concern these could be cancer.. Patient is established patient   Today patient reports: Itching w/o rash on the right dorsal hand.  Area of concern on the lower back  Review of Systems:    No other skin or systemic complaints except as noted in HPI or Assessment and Plan.  The following portions of the chart were reviewed this encounter and updated as appropriate: medications, allergies, medical history  Relevant Medical History:  n/a   Objective  (SKPE) Well appearing patient in no apparent distress; mood and affect are within normal limits. Examination was performed of the: Full Skin Examination: scalp, head, eyes, ears, nose, lips, neck, chest, axillae, abdomen, back, buttocks, bilateral upper extremities, bilateral lower extremities, hands, feet, fingers, toes, fingernails, and toenails.   Examination notable for: SKIN EXAM, Angioma(s): Scattered red vascular papule(s)  , Lentigo/lentigines: Scattered pigmented macules that are tan to brown in color and are somewhat non-uniform in shape and concentrated in the sun-exposed areas, Nevus/nevi: Scattered well-demarcated, regular, pigmented macule(s) and/or papule(s)  , Seborrheic Keratosis(es): Stuck-on appearing keratotic papule(s) on the trunk, none  irritated with redness, crusting, edema, and/or partial avulsion, Actinic Damage/Elastosis: chronic sun damage: dyspigmentation, telangiectasia, and wrinkling Right posterior hand with brown scaly plaque, - Erythema of the the upper gluteal cleft  Examination limited by: Undergarments and Patient deferred removal       Assessment & Plan  (SKAP)   SKIN CANCER SCREENING PERFORMED TODAY.  BENIGN SKIN FINDINGS  - Lentigines  - Seborrheic  keratoses  - Hemangiomas   - Nevus/Multiple Benign Nevi - Reassurance provided regarding the benign appearance of lesions noted on exam today; no treatment is indicated in the absence of symptoms/changes. - Reinforced importance of photoprotective strategies including liberal and frequent sunscreen use of a broad-spectrum SPF 30 or greater, use of protective clothing, and sun avoidance for prevention of cutaneous malignancy and photoaging.  Counseled patient on the importance of regular self-skin monitoring as well as routine clinical skin examinations as scheduled.   ACTINIC DAMAGE - Chronic condition, secondary to cumulative UV/sun exposure - Recommend daily broad spectrum sunscreen SPF 30+ to sun-exposed areas, reapply every 2 hours as needed.  - Staying in the shade or wearing long sleeves, sun glasses (UVA+UVB protection) and wide brim hats (4-inch brim around the entire circumference of the hat) are also recommended for sun protection.  - Call for new or changing lesions.  Seborrheic dermatitis Chronic and persistent condition with duration or expected duration over one year. Condition is symptomatic and bothersome to patient. Patient is flaring and not currently at treatment goal.  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 Condition is symptomatic and bothersome to patient. Patient is flaring and not  currently at treatment goal.   - Diagnosis, treatment  options, prognosis, risk/ benefit, and side effects of treatment were discussed with the patient.  - Reviewed benign but chronic nature of disease. - Discussed dry skin care at length, recommended avoidance of fragrances, short showers with luke- warm water, no scrubbing, an unscented moisturizing soap (e.g. Dove sensitive skin) limited to the groin and axillae, and frequent emollient use (Eucerin, Aquaphor, Cerave, Vanicream, Vaseline). - Reviewed proper use of topical steroids to minimize the risk of steroid-induced skin changes.  - Also discussed appropriate dry skin care including daily warm baths with gentle soap, followed by liberal bland moisturizer application.  - A patient education handout reiterating this information was provided. 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 Condition is symptomatic/ bothersome to patient. Not currently at goal. - Diagnosis, treatment options, prognosis, risk/ benefit, and side effects of treatment were discussed with the patient.  - Recommended keeping the area dry and clean - Recommended ZeoZorb  - Start nystatin (MYCOSTATIN) 100,000 unit/gram ointment; Apply topically Two (2) times a day as needed for flares - Start triamcinolone (KENALOG) 0.1 % cream; Apply topically Two (2) times a day as needed for flares  - recommend barrier cream   Was sun protection counseling provided?: Yes   Level of service outlined above   Patient instructions (SKPI)   Procedures, orders, diagnosis for this visit:    There are no diagnoses linked to this encounter.  Return to clinic: Return in about 1 year (around 05/09/2025) for TBSE.  I, Emerick Ege, CMA am acting as scribe for Lauraine JAYSON Kanaris, MD.   Documentation: I have reviewed the above documentation for accuracy and completeness, and I agree with the above.  Lauraine JAYSON Kanaris, MD  "

## 2024-05-23 ENCOUNTER — Ambulatory Visit: Admitting: Urology

## 2024-06-03 ENCOUNTER — Encounter

## 2024-06-10 SURGERY — COLONOSCOPY
Anesthesia: General

## 2024-08-05 ENCOUNTER — Ambulatory Visit: Admit: 2024-08-05 | Payer: Self-pay

## 2025-05-10 ENCOUNTER — Ambulatory Visit
